# Patient Record
Sex: Female | Born: 1935 | Race: White | Hispanic: No | Smoking: Former smoker
Health system: Southern US, Community
[De-identification: ages and names within clinical notes are randomized; demographics above are authoritative.]

## PROBLEM LIST (undated history)

## (undated) DIAGNOSIS — I4891 Unspecified atrial fibrillation: Secondary | ICD-10-CM

## (undated) DIAGNOSIS — I509 Heart failure, unspecified: Secondary | ICD-10-CM

## (undated) DIAGNOSIS — E079 Disorder of thyroid, unspecified: Secondary | ICD-10-CM

## (undated) DIAGNOSIS — I219 Acute myocardial infarction, unspecified: Secondary | ICD-10-CM

## (undated) DIAGNOSIS — Z5189 Encounter for other specified aftercare: Secondary | ICD-10-CM

## (undated) DIAGNOSIS — IMO0001 Reserved for inherently not codable concepts without codable children: Secondary | ICD-10-CM

## (undated) DIAGNOSIS — M1990 Unspecified osteoarthritis, unspecified site: Secondary | ICD-10-CM

## (undated) DIAGNOSIS — I639 Cerebral infarction, unspecified: Secondary | ICD-10-CM

## (undated) DIAGNOSIS — D649 Anemia, unspecified: Secondary | ICD-10-CM

## (undated) DIAGNOSIS — I10 Essential (primary) hypertension: Secondary | ICD-10-CM

## (undated) HISTORY — PX: BREAST BIOPSY: SHX20

## (undated) HISTORY — PX: CORONARY ARTERY BYPASS GRAFT: SHX141

---

## 1988-09-25 HISTORY — PX: OTHER SURGICAL HISTORY: SHX169

## 2011-06-25 ENCOUNTER — Emergency Department (INDEPENDENT_AMBULATORY_CARE_PROVIDER_SITE_OTHER): Payer: Medicare Other

## 2011-06-25 ENCOUNTER — Emergency Department (HOSPITAL_BASED_OUTPATIENT_CLINIC_OR_DEPARTMENT_OTHER)
Admission: EM | Admit: 2011-06-25 | Discharge: 2011-06-25 | Disposition: A | Discharge status: Home / Self Care | Payer: Medicare Other | Attending: Emergency Medicine | Admitting: Emergency Medicine

## 2011-06-25 ENCOUNTER — Encounter: Payer: Self-pay | Admitting: *Deleted

## 2011-06-25 DIAGNOSIS — G319 Degenerative disease of nervous system, unspecified: Secondary | ICD-10-CM

## 2011-06-25 DIAGNOSIS — S8000XA Contusion of unspecified knee, initial encounter: Secondary | ICD-10-CM | POA: Insufficient documentation

## 2011-06-25 DIAGNOSIS — Z79899 Other long term (current) drug therapy: Secondary | ICD-10-CM | POA: Insufficient documentation

## 2011-06-25 DIAGNOSIS — W010XXA Fall on same level from slipping, tripping and stumbling without subsequent striking against object, initial encounter: Secondary | ICD-10-CM | POA: Insufficient documentation

## 2011-06-25 DIAGNOSIS — W19XXXA Unspecified fall, initial encounter: Secondary | ICD-10-CM

## 2011-06-25 DIAGNOSIS — Z8739 Personal history of other diseases of the musculoskeletal system and connective tissue: Secondary | ICD-10-CM | POA: Insufficient documentation

## 2011-06-25 DIAGNOSIS — IMO0002 Reserved for concepts with insufficient information to code with codable children: Secondary | ICD-10-CM | POA: Insufficient documentation

## 2011-06-25 DIAGNOSIS — E079 Disorder of thyroid, unspecified: Secondary | ICD-10-CM | POA: Insufficient documentation

## 2011-06-25 DIAGNOSIS — S022XXA Fracture of nasal bones, initial encounter for closed fracture: Secondary | ICD-10-CM

## 2011-06-25 DIAGNOSIS — S0083XA Contusion of other part of head, initial encounter: Secondary | ICD-10-CM | POA: Insufficient documentation

## 2011-06-25 DIAGNOSIS — I10 Essential (primary) hypertension: Secondary | ICD-10-CM | POA: Insufficient documentation

## 2011-06-25 DIAGNOSIS — J342 Deviated nasal septum: Secondary | ICD-10-CM

## 2011-06-25 DIAGNOSIS — S8001XA Contusion of right knee, initial encounter: Secondary | ICD-10-CM

## 2011-06-25 DIAGNOSIS — S80212A Abrasion, left knee, initial encounter: Secondary | ICD-10-CM

## 2011-06-25 DIAGNOSIS — S0003XA Contusion of scalp, initial encounter: Secondary | ICD-10-CM | POA: Insufficient documentation

## 2011-06-25 HISTORY — DX: Encounter for other specified aftercare: Z51.89

## 2011-06-25 HISTORY — DX: Unspecified osteoarthritis, unspecified site: M19.90

## 2011-06-25 HISTORY — DX: Essential (primary) hypertension: I10

## 2011-06-25 HISTORY — DX: Disorder of thyroid, unspecified: E07.9

## 2011-06-25 HISTORY — DX: Reserved for inherently not codable concepts without codable children: IMO0001

## 2011-06-25 HISTORY — DX: Acute myocardial infarction, unspecified: I21.9

## 2011-06-25 MED ORDER — TETANUS-DIPHTHERIA TOXOIDS TD 5-2 LFU IM INJ
0.5000 mL | INJECTION | Freq: Once | INTRAMUSCULAR | Status: AC
  Administered 2011-06-25: 0.5 mL via INTRAMUSCULAR
  Filled 2011-06-25: qty 0.5

## 2011-06-25 MED ORDER — TRAMADOL HCL 50 MG PO TABS: 50.0000 mg | ORAL_TABLET | Freq: Four times a day (QID) | ORAL | Status: AC | PRN

## 2011-06-25 NOTE — ED Notes (Signed)
Care plan and wound care reviewed with pt and family

## 2011-06-25 NOTE — ED Provider Notes (Signed)
History  Scribed for Dr. Golda Acre, the patient was seen in room MH07. The chart was scribed by Gilman Schmidt. The patients care was started at 1713.  CSN: [id number] Arrival date & time: 06/25/2011  4:56 PM  Chief Complaint  Patient presents with  . Head Laceration  . Fall    Patient is a 77 y.o. female presenting with scalp laceration and fall.  Head Laceration Pertinent negatives include no headaches.  Fall Pertinent negatives include no headaches.   Melissa Cardenas is a 77 y.o. female who presents to the Emergency Department complaining of head laceration from fall. Pt reports that she was walking to her car on uneven pavement and tripped and fell on her nose. Presents with laceration to bridge of nose and hematoma on right side of forehead. Pt is ambulatory. Denies any syncope or lightheadedness. Denies any other pain. There are no other associated symptoms and no other alleviating or aggravating factors.  Currently on Plavix.    PAST MEDICAL HISTORY:  Past Medical History  Diagnosis Date  . Arthritis   . Blood transfusion   . Heart attack   . Hypertension   . Thyroid disease      PAST SURGICAL HISTORY:  Past Surgical History  Procedure Date  . Cabg   . Breast biopsy      MEDICATIONS:  Previous Medications   CARVEDILOL (COREG) 6.25 MG TABLET    Take 6.25 mg by mouth 2 (two) times daily with a meal. Take 1 tab in the morning and 2 tabs at night    CLOPIDOGREL (PLAVIX) 75 MG TABLET    Take 75 mg by mouth daily.     EZETIMIBE (ZETIA) 10 MG TABLET    Take 10 mg by mouth daily.     RANOLAZINE (RANEXA) 500 MG 12 HR TABLET    Take 500 mg by mouth 2 (two) times daily.       ALLERGIES:  Allergies as of 06/25/2011 - Review Complete 06/25/2011  Allergen Reaction Noted  . Atromid s (clofibrate)  06/25/2011  . Avelox (moxifloxacin hydrochloride)  06/25/2011  . Nitrofuran derivatives  06/25/2011  . Other  06/25/2011  . Penicillins Hives and Itching 06/25/2011  . Sulfa antibiotics  Swelling 06/25/2011     FAMILY HISTORY:  No family history on file.   SOCIAL HISTORY: History  Substance Use Topics  . Smoking status: Former Games developer  . Smokeless tobacco: Not on file  . Alcohol Use: Not on file       Review of Systems  HENT: Positive for facial swelling. Negative for neck pain and neck stiffness.   Skin:       Laceration  Neurological: Negative for syncope, light-headedness and headaches.  All other systems reviewed and are negative.    Allergies  Atromid s; Avelox; Nitrofuran derivatives; Other; Penicillins; and Sulfa antibiotics  Home Medications   Current Outpatient Rx  Name Route Sig Dispense Refill  . CARVEDILOL 6.25 MG PO TABS Oral Take 6.25 mg by mouth 2 (two) times daily with a meal. Take 1 tab in the morning and 2 tabs at night     . CLOPIDOGREL BISULFATE 75 MG PO TABS Oral Take 75 mg by mouth daily.      Marland Kitchen EZETIMIBE 10 MG PO TABS Oral Take 10 mg by mouth daily.      Marland Kitchen RANOLAZINE 500 MG PO TB12 Oral Take 500 mg by mouth 2 (two) times daily.        BP 194/93  Pulse 78  Temp(Src) 98.1 F (36.7 C) (Oral)  Resp 20  Ht 5' (1.524 m)  Wt 106 lb (48.081 kg)  BMI 20.70 kg/m2  SpO2 97%  Physical Exam  Constitutional: She is oriented to person, place, and time. She appears well-developed and well-nourished.  Non-toxic appearance. She does not have a sickly appearance.  HENT:  Head: Normocephalic and atraumatic.  Nose: Nose lacerations (bridge of nose ) present.       Patient has a skin tear across the bridge of her nose without any significant depth to it. Right lateral forehead hematoma has mild abrasion that is approximately 8 mm in diameter on the top and a laceration present.  Eyes: Conjunctivae, EOM and lids are normal. Pupils are equal, round, and reactive to light. No scleral icterus.       Ecchymosis bilaterally   Neck: Trachea normal and normal range of motion. Neck supple.       No C-spine tendernes  Cardiovascular: Regular rhythm and  normal heart sounds.   Pulmonary/Chest: Effort normal and breath sounds normal.  Abdominal: Soft. Normal appearance. There is no tenderness. There is no rebound, no guarding and no CVA tenderness.  Musculoskeletal: Normal range of motion.       Left forearm has a hematoma but there is no tenderness over the bones. Right knee is swollen and has bruising present but patient is able to flex and extend her knee without any difficulty and is able to bear weight on that leg. Left knee has mild abrasion over it but again she is able flex and extend without difficulty and able to bear weight on it.  Neurological: She is alert and oriented to person, place, and time. She has normal strength.  Skin: Skin is warm, dry and intact. No rash noted.       Abrasion/Hematoma 3cm in diameter on forehead Laceration/Brusing on knees bilaterally   Psychiatric: She has a normal mood and affect. Her behavior is normal. Judgment and thought content normal.     ED Course  Procedures  OTHER DATA REVIEWED: Nursing notes, vital signs, and past medical records reviewed.  DIAGNOSTIC STUDIES: Oxygen Saturation is 97% on room air, normal by my interpretation.     RADIOLOGY:No results found for this or any previous visit. Ct Head Wo Contrast  06/25/2011  *RADIOLOGY REPORT*  Clinical Data:  Fall  CT HEAD WITHOUT CONTRAST CT MAXILLOFACIAL WITHOUT CONTRAST  Technique:  Multidetector CT imaging of the head and maxillofacial structures were performed using the standard protocol without intravenous contrast. Multiplanar CT image reconstructions of the maxillofacial structures were also generated.  Comparison:  None.  CT HEAD  Findings: No skull fracture is noted.  There is scalp swelling and subcutaneous stranding in right frontal lateral scalp.  Mild cerebral atrophy.  No intracranial hemorrhage, mass effect or midline shift.  No acute infarction.  No mass lesion is noted on this unenhanced scan.  IMPRESSION: No acute intracranial  abnormality.  Scalp swelling and subcutaneous stranding in the right lateral scalp mild cerebral atrophy.  CT MAXILLOFACIAL  Findings:   Axial images shows bilateral nasal bone fracture.  Metallic dental artifact are noted.  No paranasal sinuses air fluid levels.  Computer processed images shows no orbital floor or orbital rim fracture.  No zygomatic fracture is noted.  Mild perinasal soft tissue swelling is noted.  There is mild left nasal septum deviation.  Bilateral semilunar canal is patent.  No air-fluid levels are noted in the paranasal sinuses.  No orbital hematoma  or fluid collection. There is no mandibular fracture  identified.  No TMJ dislocation.  IMPRESSION:  1.  Bilateral fractures of the nasal bones. 2.  Mild left nasal septum deviation. 3.  No orbital rim or orbital floor fracture.  No orbital hematoma.  Original Report Authenticated By: Natasha Mead, M.D.   Ct Maxillofacial Wo Cm  06/25/2011  *RADIOLOGY REPORT*  Clinical Data:  Fall  CT HEAD WITHOUT CONTRAST CT MAXILLOFACIAL WITHOUT CONTRAST  Technique:  Multidetector CT imaging of the head and maxillofacial structures were performed using the standard protocol without intravenous contrast. Multiplanar CT image reconstructions of the maxillofacial structures were also generated.  Comparison:  None.  CT HEAD  Findings: No skull fracture is noted.  There is scalp swelling and subcutaneous stranding in right frontal lateral scalp.  Mild cerebral atrophy.  No intracranial hemorrhage, mass effect or midline shift.  No acute infarction.  No mass lesion is noted on this unenhanced scan.  IMPRESSION: No acute intracranial abnormality.  Scalp swelling and subcutaneous stranding in the right lateral scalp mild cerebral atrophy.  CT MAXILLOFACIAL  Findings:   Axial images shows bilateral nasal bone fracture.  Metallic dental artifact are noted.  No paranasal sinuses air fluid levels.  Computer processed images shows no orbital floor or orbital rim fracture.  No  zygomatic fracture is noted.  Mild perinasal soft tissue swelling is noted.  There is mild left nasal septum deviation.  Bilateral semilunar canal is patent.  No air-fluid levels are noted in the paranasal sinuses.  No orbital hematoma or fluid collection. There is no mandibular fracture  identified.  No TMJ dislocation.  IMPRESSION:  1.  Bilateral fractures of the nasal bones. 2.  Mild left nasal septum deviation. 3.  No orbital rim or orbital floor fracture.  No orbital hematoma.  Original Report Authenticated By: Natasha Mead, M.D.     CT Head  CT MAxillofacial   MDM: Patient is on Plavix so a CT of her head was obtained to rule out any intracranial hemorrhage but is not present at this point in time. Patient is at her baseline mental status an alert and oriented with no loss of consciousness at the time of the incident. I feel she is safe for discharge home from the standpoint. She does have nasal bone fractures which I've instructed her to be followed up by a facial surgeon or air nose and throat physician in one to 2 weeks once swelling is decreased. She has no nasal septal hematomas or respiratory distress at this point in time despite the septum deviation found on her CT. Her skin tear across her nose is unable to be repaired since a piece of the skin is actually missing and there is no significant depth to the wound to be repaired. Patient will be discharged home and family members at bedside to assist her.  IMPRESSION: Diagnoses that have been ruled out:  Diagnoses that are still under consideration:  Final diagnoses:    PLAN:  Home. The patient is to return the emergency department if there is any worsening of symptoms. I have reviewed the discharge instructions with the patient. CONDITION ON DISCHARGE: Stable.   MEDICATIONS GIVEN IN THE E.D.  Medications  carvedilol (COREG) 6.25 MG tablet (not administered)  ranolazine (RANEXA) 500 MG 12 hr tablet (not administered)  clopidogrel  (PLAVIX) 75 MG tablet (not administered)  ezetimibe (ZETIA) 10 MG tablet (not administered)  tetanus & diphtheria toxoids (adult) [facility]) injection 0.5 mL (not administered)  rosuvastatin (CRESTOR) 40 MG tablet (not administered)  cholecalciferol (VITAMIN D) 1000 UNITS tablet (not administered)  Calcium Carbonate-Vitamin D (CALTRATE 600+D) 600-400 MG-UNIT per tablet (not administered)  pantoprazole (PROTONIX) 40 MG tablet (not administered)  polyethylene glycol powder (GLYCOLAX/MIRALAX) powder (not administered)  aspirin EC 81 MG tablet (not administered)    DISCHARGE MEDICATIONS: New Prescriptions   No medications on file    SCRIBE ATTESTATION: I personally performed the services described in this documentation, which was scribed in my presence. The recorded information has been reviewed and considered.              Nat Christen, MD 06/25/11 [phone]

## 2011-06-25 NOTE — ED Notes (Signed)
Bacitracin and drsg applied to all abrasions sterile drsg to nose  No active bleeding MD at side care reviewed

## 2011-06-25 NOTE — ED Notes (Signed)
Patient ambulates to restroom without difficulty.

## 2011-06-25 NOTE — ED Notes (Signed)
Pt reports she fell on uneven pavement- laceration to bridge of nose and hematoma right side of forehead- denies LOC- denies neck pain

## 2011-06-25 NOTE — ED Notes (Signed)
1 inch skin tear abrasion to mid nose brow/ 2 inch hematoma to R side upper forehead bruising to Knees bilaterally Dr Golda Acre at side bleeding controlled care plan reviewed

## 2016-09-24 ENCOUNTER — Encounter (HOSPITAL_BASED_OUTPATIENT_CLINIC_OR_DEPARTMENT_OTHER): Payer: Self-pay | Admitting: *Deleted

## 2016-09-24 ENCOUNTER — Emergency Department (HOSPITAL_BASED_OUTPATIENT_CLINIC_OR_DEPARTMENT_OTHER)
Admission: EM | Admit: 2016-09-24 | Discharge: 2016-09-24 | Disposition: A | Discharge status: Home / Self Care | Payer: Medicare Other | Attending: Emergency Medicine | Admitting: Emergency Medicine

## 2016-09-24 DIAGNOSIS — I10 Essential (primary) hypertension: Secondary | ICD-10-CM | POA: Insufficient documentation

## 2016-09-24 DIAGNOSIS — Z7982 Long term (current) use of aspirin: Secondary | ICD-10-CM | POA: Insufficient documentation

## 2016-09-24 DIAGNOSIS — R319 Hematuria, unspecified: Secondary | ICD-10-CM | POA: Diagnosis present

## 2016-09-24 DIAGNOSIS — N3091 Cystitis, unspecified with hematuria: Secondary | ICD-10-CM | POA: Insufficient documentation

## 2016-09-24 DIAGNOSIS — Z87891 Personal history of nicotine dependence: Secondary | ICD-10-CM | POA: Diagnosis not present

## 2016-09-24 DIAGNOSIS — Z79899 Other long term (current) drug therapy: Secondary | ICD-10-CM | POA: Insufficient documentation

## 2016-09-24 DIAGNOSIS — N390 Urinary tract infection, site not specified: Secondary | ICD-10-CM

## 2016-09-24 DIAGNOSIS — N3090 Cystitis, unspecified without hematuria: Secondary | ICD-10-CM

## 2016-09-24 DIAGNOSIS — R310 Gross hematuria: Secondary | ICD-10-CM

## 2016-09-24 LAB — URINALYSIS, ROUTINE W REFLEX MICROSCOPIC

## 2016-09-24 LAB — URINALYSIS, MICROSCOPIC (REFLEX)

## 2016-09-24 MED ORDER — PHENAZOPYRIDINE HCL 200 MG PO TABS: 200.0000 mg | ORAL_TABLET | Freq: Three times a day (TID) | ORAL | 0 refills | Status: DC

## 2016-09-24 MED ORDER — PHENAZOPYRIDINE HCL 100 MG PO TABS
100.0000 mg | ORAL_TABLET | Freq: Once | ORAL | Status: AC
  Administered 2016-09-24: 100 mg via ORAL
  Filled 2016-09-24: qty 1

## 2016-09-24 MED ORDER — CIPROFLOXACIN HCL 500 MG PO TABS
500.0000 mg | ORAL_TABLET | Freq: Two times a day (BID) | ORAL | 0 refills | Status: DC
Start: 2016-09-24 — End: 2021-08-17

## 2016-09-24 MED ORDER — CIPROFLOXACIN HCL 500 MG PO TABS
500.0000 mg | ORAL_TABLET | Freq: Once | ORAL | Status: AC
  Administered 2016-09-24: 500 mg via ORAL
  Filled 2016-09-24: qty 1

## 2016-09-24 NOTE — ED Triage Notes (Addendum)
Pt report hematuria, dysuria, frequency and urgency that developed around 0700 today. Denies fever, n/v/d, abd pain. Reports taking baby ASA (hasn't taken today).

## 2016-09-24 NOTE — Discharge Instructions (Signed)
Recheck with your physician if the blood in your urine, or symptoms persist more than 4-5 days.

## 2016-09-24 NOTE — ED Triage Notes (Addendum)
Pt has brought a urine specimen from home that was collected in a sterile specimen cup around 0805 this am.

## 2016-09-24 NOTE — ED Provider Notes (Signed)
[facility]   CSN: [id number] Arrival date & time: 09/24/16  0844     History   Chief Complaint Chief Complaint  Patient presents with  . Hematuria    HPI Melissa Cardenas is a 82 y.o. female. She presents with hematuria and painful urination. She waking this morning asymptomatic after emptying her bladder she noticed blood. She's had frequency every 15 minutes since that time with burning. No back pain. No nausea vomiting or fever.  HPI  Past Medical History:  Diagnosis Date  . Arthritis   . Blood transfusion   . Heart attack   . Hypertension   . Thyroid disease     There are no active problems to display for this patient.   Past Surgical History:  Procedure Laterality Date  . BREAST BIOPSY    . cabg      OB History    No data available       Home Medications    Prior to Admission medications   Medication Sig Start Date End Date Taking? Authorizing Provider  aspirin EC 81 MG tablet Take 81 mg by mouth daily.     Yes Historical Provider, MD  Calcium Carbonate-Vitamin D (CALTRATE 600+D) 600-400 MG-UNIT per tablet Take 1 tablet by mouth daily.     Yes Historical Provider, MD  carvedilol (COREG) 6.25 MG tablet Take 6.25 mg by mouth 2 (two) times daily with a meal. Take 1 tab in the morning and 2 tabs at night    Yes Historical Provider, MD  cholecalciferol (VITAMIN D) 1000 UNITS tablet Take 1,000 Units by mouth daily.     Yes Historical Provider, MD  Hyoscyamine Sulfate SL 0.125 MG SUBL Place under the tongue.   Yes Historical Provider, MD  Multiple Vitamins-Minerals (CENTRUM SILVER PO) Take by mouth.   Yes Historical Provider, MD  nitroGLYCERIN (NITROSTAT) 0.4 MG SL tablet Place 0.4 mg under the tongue every 5 (five) minutes as needed for chest pain.   Yes Historical Provider, MD  polyethylene glycol powder (GLYCOLAX/MIRALAX) powder Take 17 g by mouth daily.     Yes Historical Provider, MD  ranolazine (RANEXA) 500 MG 12 hr tablet Take  500 mg by mouth 2 (two) times daily.     Yes Historical Provider, MD  rosuvastatin (CRESTOR) 40 MG tablet Take 40 mg by mouth daily.     Yes Historical Provider, MD  ciprofloxacin (CIPRO) 500 MG tablet Take 1 tablet (500 mg total) by mouth 2 (two) times daily. 09/24/16   Rolland PorterMark Meighan Treto, MD  clopidogrel (PLAVIX) 75 MG tablet Take 75 mg by mouth daily.      Historical Provider, MD  ezetimibe (ZETIA) 10 MG tablet Take 10 mg by mouth daily.      Historical Provider, MD  pantoprazole (PROTONIX) 40 MG tablet Take 40 mg by mouth daily.      Historical Provider, MD  phenazopyridine (PYRIDIUM) 200 MG tablet Take 1 tablet (200 mg total) by mouth 3 (three) times daily. 09/24/16   Rolland PorterMark Halimah Bewick, MD    Family History No family history on file.  Social History Social History  Substance Use Topics  . Smoking status: Former Games developermoker  . Smokeless tobacco: Never Used  . Alcohol use No     Allergies   Atromid s [clofibrate]; Avelox [moxifloxacin hydrochloride]; Nitrofuran derivatives; Other; Penicillins; and Sulfa antibiotics   Review of Systems Review of Systems  Constitutional: Negative for appetite change, chills, diaphoresis, fatigue and fever.  HENT: Negative for mouth  sores, sore throat and trouble swallowing.   Eyes: Negative for visual disturbance.  Respiratory: Negative for cough, chest tightness, shortness of breath and wheezing.   Cardiovascular: Negative for chest pain.  Gastrointestinal: Negative for abdominal distention, abdominal pain, diarrhea, nausea and vomiting.  Endocrine: Negative for polydipsia, polyphagia and polyuria.  Genitourinary: Positive for dysuria and hematuria. Negative for frequency.  Musculoskeletal: Negative for gait problem.  Skin: Negative for color change, pallor and rash.  Neurological: Negative for dizziness, syncope, light-headedness and headaches.  Hematological: Does not bruise/bleed easily.  Psychiatric/Behavioral: Negative for behavioral problems and confusion.      Physical Exam Updated Vital Signs BP 177/91 (BP Location: Right Arm)   Pulse 82   Temp 97.8 F (36.6 C) (Oral)   Resp 18   Ht 4\' 11"  (1.499 m)   Wt 106 lb (48.1 kg)   SpO2 95%   BMI 21.41 kg/m   Physical Exam  Constitutional: She is oriented to person, place, and time. She appears well-developed and well-nourished. No distress.  HENT:  Head: Normocephalic.  Eyes: Conjunctivae are normal. Pupils are equal, round, and reactive to light. No scleral icterus.  Neck: Normal range of motion. Neck supple. No thyromegaly present.  Cardiovascular: Normal rate and regular rhythm.  Exam reveals no gallop and no friction rub.   No murmur heard. Pulmonary/Chest: Effort normal and breath sounds normal. No respiratory distress. She has no wheezes. She has no rales.  Abdominal: Soft. Bowel sounds are normal. She exhibits no distension. There is no tenderness. There is no rebound.  Musculoskeletal: Normal range of motion.  Neurological: She is alert and oriented to person, place, and time.  Skin: Skin is warm and dry. No rash noted.  Psychiatric: She has a normal mood and affect. Her behavior is normal.     ED Treatments / Results  Labs (all labs ordered are listed, but only abnormal results are displayed) Labs Reviewed  URINALYSIS, ROUTINE W REFLEX MICROSCOPIC    EKG  EKG Interpretation None       Radiology No results found.  Procedures Procedures (including critical care time)  Medications Ordered in ED Medications  ciprofloxacin (CIPRO) tablet 500 mg (500 mg Oral Given 09/24/16 0922)  phenazopyridine (PYRIDIUM) tablet 100 mg (100 mg Oral Given 09/24/16 [id number])     Initial Impression / Assessment and Plan / ED Course  I have reviewed the triage vital signs and the nursing notes.  Pertinent labs & imaging results that were available during my care of the patient were reviewed by me and considered in my medical decision making (see chart for details).  Clinical  Course     Likely simple cystitis. Hemorrhagic. No symptoms or findings to suggest upper tract disease or pyelonephritis. With consideration of her multiple allergies Cipro is the best option. Given Cipro Pyridium. Prescriptions for the same.  Final Clinical Impressions(s) / ED Diagnoses   Final diagnoses:  Cystitis  Gross hematuria  Lower urinary tract infectious disease    New Prescriptions New Prescriptions   CIPROFLOXACIN (CIPRO) 500 MG TABLET    Take 1 tablet (500 mg total) by mouth 2 (two) times daily.   PHENAZOPYRIDINE (PYRIDIUM) 200 MG TABLET    Take 1 tablet (200 mg total) by mouth 3 (three) times daily.     Rolland PorterMark Kazue Cerro, MD 09/24/16 0930

## 2016-11-05 ENCOUNTER — Observation Stay (HOSPITAL_COMMUNITY): Payer: Medicare Other

## 2016-11-05 ENCOUNTER — Encounter (HOSPITAL_COMMUNITY): Payer: Self-pay | Admitting: Emergency Medicine

## 2016-11-05 ENCOUNTER — Observation Stay (HOSPITAL_COMMUNITY)
Admission: EM | Admit: 2016-11-05 | Discharge: 2016-11-06 | Disposition: A | Discharge status: Home / Self Care | Payer: Medicare Other | Attending: Internal Medicine | Admitting: Internal Medicine

## 2016-11-05 ENCOUNTER — Emergency Department (HOSPITAL_COMMUNITY): Payer: Medicare Other

## 2016-11-05 DIAGNOSIS — E079 Disorder of thyroid, unspecified: Secondary | ICD-10-CM | POA: Diagnosis not present

## 2016-11-05 DIAGNOSIS — I110 Hypertensive heart disease with heart failure: Secondary | ICD-10-CM | POA: Insufficient documentation

## 2016-11-05 DIAGNOSIS — Z7902 Long term (current) use of antithrombotics/antiplatelets: Secondary | ICD-10-CM | POA: Insufficient documentation

## 2016-11-05 DIAGNOSIS — Z79899 Other long term (current) drug therapy: Secondary | ICD-10-CM | POA: Diagnosis not present

## 2016-11-05 DIAGNOSIS — G459 Transient cerebral ischemic attack, unspecified: Secondary | ICD-10-CM

## 2016-11-05 DIAGNOSIS — Z7982 Long term (current) use of aspirin: Secondary | ICD-10-CM | POA: Diagnosis not present

## 2016-11-05 DIAGNOSIS — I10 Essential (primary) hypertension: Secondary | ICD-10-CM | POA: Insufficient documentation

## 2016-11-05 DIAGNOSIS — I639 Cerebral infarction, unspecified: Secondary | ICD-10-CM | POA: Diagnosis not present

## 2016-11-05 DIAGNOSIS — E785 Hyperlipidemia, unspecified: Secondary | ICD-10-CM | POA: Insufficient documentation

## 2016-11-05 DIAGNOSIS — Z792 Long term (current) use of antibiotics: Secondary | ICD-10-CM | POA: Insufficient documentation

## 2016-11-05 DIAGNOSIS — Z951 Presence of aortocoronary bypass graft: Secondary | ICD-10-CM | POA: Diagnosis not present

## 2016-11-05 DIAGNOSIS — I5030 Unspecified diastolic (congestive) heart failure: Secondary | ICD-10-CM | POA: Insufficient documentation

## 2016-11-05 DIAGNOSIS — Z8249 Family history of ischemic heart disease and other diseases of the circulatory system: Secondary | ICD-10-CM | POA: Insufficient documentation

## 2016-11-05 DIAGNOSIS — R29810 Facial weakness: Secondary | ICD-10-CM | POA: Insufficient documentation

## 2016-11-05 DIAGNOSIS — R4781 Slurred speech: Secondary | ICD-10-CM | POA: Insufficient documentation

## 2016-11-05 DIAGNOSIS — Z87891 Personal history of nicotine dependence: Secondary | ICD-10-CM | POA: Insufficient documentation

## 2016-11-05 DIAGNOSIS — I2510 Atherosclerotic heart disease of native coronary artery without angina pectoris: Secondary | ICD-10-CM | POA: Diagnosis not present

## 2016-11-05 DIAGNOSIS — R262 Difficulty in walking, not elsewhere classified: Secondary | ICD-10-CM

## 2016-11-05 DIAGNOSIS — E7849 Other hyperlipidemia: Secondary | ICD-10-CM

## 2016-11-05 LAB — PROTIME-INR
INR: 1.09
PROTHROMBIN TIME: 14.1 s (ref 11.4–15.2)

## 2016-11-05 LAB — LIPID PANEL
Cholesterol: 156 mg/dL (ref 0–200)
HDL: 52 mg/dL (ref >40)
LDL CALC: 94 mg/dL (ref 0–99)
Total CHOL/HDL Ratio: 3 RATIO
Triglycerides: 48 mg/dL (ref <150)
VLDL: 10 mg/dL (ref 0–40)

## 2016-11-05 LAB — COMPREHENSIVE METABOLIC PANEL
ALK PHOS: 49 U/L (ref 38–126)
ALT: 18 U/L (ref 14–54)
ANION GAP: 10 (ref 5–15)
AST: 28 U/L (ref 15–41)
Albumin: 3.3 g/dL — ABNORMAL LOW (ref 3.5–5.0)
BUN: 12 mg/dL (ref 6–20)
CHLORIDE: 100 mmol/L — AB (ref 101–111)
CO2: 25 mmol/L (ref 22–32)
Calcium: 9.1 mg/dL (ref 8.9–10.3)
Creatinine, Ser: 0.75 mg/dL (ref 0.44–1.00)
GFR calc non Af Amer: >60 mL/min (ref >60)
GLUCOSE: 106 mg/dL — AB (ref 65–99)
POTASSIUM: 3.9 mmol/L (ref 3.5–5.1)
SODIUM: 135 mmol/L (ref 135–145)
Total Bilirubin: 0.8 mg/dL (ref 0.3–1.2)
Total Protein: 6.4 g/dL — ABNORMAL LOW (ref 6.5–8.1)

## 2016-11-05 LAB — I-STAT CHEM 8, ED
BUN: 13 mg/dL (ref 6–20)
CHLORIDE: 101 mmol/L (ref 101–111)
Calcium, Ion: 1.18 mmol/L (ref 1.15–1.40)
Creatinine, Ser: 0.7 mg/dL (ref 0.44–1.00)
GLUCOSE: 105 mg/dL — AB (ref 65–99)
HCT: 33 % — ABNORMAL LOW (ref 36.0–46.0)
HEMOGLOBIN: 11.2 g/dL — AB (ref 12.0–15.0)
POTASSIUM: 3.9 mmol/L (ref 3.5–5.1)
SODIUM: 137 mmol/L (ref 135–145)
TCO2: 26 mmol/L (ref 0–100)

## 2016-11-05 LAB — URINALYSIS, ROUTINE W REFLEX MICROSCOPIC
BILIRUBIN URINE: NEGATIVE
GLUCOSE, UA: NEGATIVE mg/dL
Hgb urine dipstick: NEGATIVE
KETONES UR: NEGATIVE mg/dL
LEUKOCYTES UA: NEGATIVE
NITRITE: NEGATIVE
PH: 8 (ref 5.0–8.0)
Protein, ur: NEGATIVE mg/dL
SPECIFIC GRAVITY, URINE: 1.005 (ref 1.005–1.030)

## 2016-11-05 LAB — I-STAT TROPONIN, ED: Troponin i, poc: 0.01 ng/mL (ref 0.00–0.08)

## 2016-11-05 LAB — CBC
HCT: 32.8 % — ABNORMAL LOW (ref 36.0–46.0)
Hemoglobin: 11.1 g/dL — ABNORMAL LOW (ref 12.0–15.0)
MCH: 29.9 pg (ref 26.0–34.0)
MCHC: 33.8 g/dL (ref 30.0–36.0)
MCV: 88.4 fL (ref 78.0–100.0)
PLATELETS: 196 10*3/uL (ref 150–400)
RBC: 3.71 MIL/uL — AB (ref 3.87–5.11)
RDW: 14.7 % (ref 11.5–15.5)
WBC: 4.9 10*3/uL (ref 4.0–10.5)

## 2016-11-05 LAB — DIFFERENTIAL
BASOS PCT: 1 %
Basophils Absolute: 0 10*3/uL (ref 0.0–0.1)
EOS ABS: 0.2 10*3/uL (ref 0.0–0.7)
EOS PCT: 4 %
Lymphocytes Relative: 22 %
Lymphs Abs: 1.1 10*3/uL (ref 0.7–4.0)
MONO ABS: 0.4 10*3/uL (ref 0.1–1.0)
Monocytes Relative: 8 %
NEUTROS PCT: 65 %
Neutro Abs: 3.2 10*3/uL (ref 1.7–7.7)

## 2016-11-05 LAB — RAPID URINE DRUG SCREEN, HOSP PERFORMED
AMPHETAMINES: NOT DETECTED
Barbiturates: NOT DETECTED
Benzodiazepines: NOT DETECTED
COCAINE: NOT DETECTED
OPIATES: NOT DETECTED
TETRAHYDROCANNABINOL: NOT DETECTED

## 2016-11-05 LAB — CBG MONITORING, ED: GLUCOSE-CAPILLARY: 103 mg/dL — AB (ref 65–99)

## 2016-11-05 LAB — APTT: aPTT: 28 seconds (ref 24–36)

## 2016-11-05 MED ORDER — ADULT MULTIVITAMIN W/MINERALS CH
1.0000 | ORAL_TABLET | Freq: Every day | ORAL | Status: DC
Start: 2016-11-05 — End: 2016-11-06
  Administered 2016-11-05 – 2016-11-06 (×2): 1 via ORAL
  Filled 2016-11-05 (×2): qty 1

## 2016-11-05 MED ORDER — RANOLAZINE ER 500 MG PO TB12
500.0000 mg | ORAL_TABLET | Freq: Two times a day (BID) | ORAL | Status: DC
  Administered 2016-11-05 – 2016-11-06 (×2): 500 mg via ORAL
  Filled 2016-11-05 (×2): qty 1

## 2016-11-05 MED ORDER — POLYETHYLENE GLYCOL 3350 17 GM/SCOOP PO POWD
17.0000 g | Freq: Every day | ORAL | Status: DC
  Filled 2016-11-05: qty 255

## 2016-11-05 MED ORDER — HYOSCYAMINE SULFATE 0.125 MG SL SUBL
0.1250 mg | SUBLINGUAL_TABLET | SUBLINGUAL | Status: DC | PRN
  Filled 2016-11-05: qty 1

## 2016-11-05 MED ORDER — NITROGLYCERIN 0.4 MG SL SUBL: 0.4000 mg | SUBLINGUAL_TABLET | SUBLINGUAL | Status: DC | PRN

## 2016-11-05 MED ORDER — ASPIRIN 300 MG RE SUPP: 300.0000 mg | Freq: Every day | RECTAL | Status: DC

## 2016-11-05 MED ORDER — ACETAMINOPHEN 160 MG/5ML PO SOLN: 650.0000 mg | ORAL | Status: DC | PRN

## 2016-11-05 MED ORDER — ACETAMINOPHEN 650 MG RE SUPP
650.0000 mg | RECTAL | Status: DC | PRN
Start: 2016-11-05 — End: 2016-11-06

## 2016-11-05 MED ORDER — ASPIRIN 325 MG PO TABS
325.0000 mg | ORAL_TABLET | Freq: Every day | ORAL | Status: DC
  Administered 2016-11-05 – 2016-11-06 (×2): 325 mg via ORAL
  Filled 2016-11-05 (×2): qty 1

## 2016-11-05 MED ORDER — STROKE: EARLY STAGES OF RECOVERY BOOK
Freq: Once | Status: DC
  Filled 2016-11-05: qty 1

## 2016-11-05 MED ORDER — ACETAMINOPHEN 325 MG PO TABS: 650.0000 mg | ORAL_TABLET | ORAL | Status: DC | PRN

## 2016-11-05 MED ORDER — ROSUVASTATIN CALCIUM 40 MG PO TABS
40.0000 mg | ORAL_TABLET | Freq: Every day | ORAL | Status: DC
  Administered 2016-11-05: 40 mg via ORAL
  Filled 2016-11-05: qty 1
  Filled 2016-11-05: qty 2
  Filled 2016-11-05: qty 1

## 2016-11-05 MED ORDER — POLYETHYLENE GLYCOL 3350 17 G PO PACK
17.0000 g | PACK | Freq: Every day | ORAL | Status: DC
  Administered 2016-11-05: 17 g via ORAL
  Filled 2016-11-05 (×2): qty 1

## 2016-11-05 MED ORDER — ENOXAPARIN SODIUM 40 MG/0.4ML ~~LOC~~ SOLN
40.0000 mg | SUBCUTANEOUS | Status: DC
  Administered 2016-11-06: 40 mg via SUBCUTANEOUS
  Filled 2016-11-05: qty 0.4

## 2016-11-05 NOTE — Evaluation (Signed)
Physical Therapy Evaluation Patient Details Name: Melissa Cardenas MRN: [id number] DOB: [birth year] Today's Date: 11/05/2016   History of Present Illness  83 year old female with a medical history of hypertension, thyroid disease, hyperlipidemia and coronary artery disease status post CABG who presents to the emergency department with slurred speech and right-sided facial droop.  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  Clinical Impression  PT eval complete. Pt is independent with all functional mobility. R facial droop still present at time of eval. No further PT intervention indicated. PT signing off.    Follow Up Recommendations No PT follow up;Supervision - Intermittent    Equipment Recommendations  None recommended by PT    Recommendations for Other Services       Precautions / Restrictions Precautions Precautions: None      Mobility  Bed Mobility Overal bed mobility: Independent                Transfers Overall transfer level: Independent Equipment used: None                Ambulation/Gait Ambulation/Gait assistance: Modified independent (Device/Increase time) Ambulation Distance (Feet): 250 Feet Assistive device: None Gait Pattern/deviations: WFL(Within Functional Limits)   Gait velocity interpretation: at or above normal speed for age/gender    Stairs            Wheelchair Mobility    Modified Rankin (Stroke Patients Only) Modified Rankin (Stroke Patients Only) Pre-Morbid Rankin Score: No symptoms Modified Rankin: No significant disability     Balance Overall balance assessment: No apparent balance deficits (not formally assessed)                                           Pertinent Vitals/Pain Pain Assessment: Faces Faces Pain Scale: Hurts little more Pain Location: low back after ambulation Pain Descriptors / Indicators: Aching;Sore Pain Intervention(s): Limited activity within patient's  tolerance;Repositioned;Relaxation    Home Living Family/patient expects to be discharged to:: Private residence Living Arrangements: Alone Available Help at Discharge: Family;Available PRN/intermittently (daughter lives close by) Type of Home: Apartment Home Access: Level entry     Home Layout: One level Home Equipment: Walker - 4 wheels;Cane - single point;Electric scooter      Prior Function Level of Independence: Independent with assistive device(s)         Comments: Lives alone. Independent. Drives. Does all her own cooking, housekeeping and shopping. Occassional use of cane. RW uses for increased ambulation in community due to chronic back pain. Electric scooter used for big trips [place]     Hand Dominance        Extremity/Trunk Assessment   Upper Extremity Assessment Upper Extremity Assessment: Defer to OT evaluation    Lower Extremity Assessment Lower Extremity Assessment: Overall WFL for tasks assessed (R/L symmetrical )    Cervical / Trunk Assessment Cervical / Trunk Assessment: Kyphotic  Communication   Communication: No difficulties  Cognition Arousal/Alertness: Awake/alert Behavior During Therapy: WFL for tasks assessed/performed Overall Cognitive Status: Within Functional Limits for tasks assessed                      General Comments      Exercises     Assessment/Plan    PT Assessment Patent does not need any further PT services  PT Problem List  PT Treatment Interventions      PT Goals (Current goals can be found in the Care Plan section)  Acute Rehab PT Goals Patient Stated Goal: home PT Goal Formulation: All assessment and education complete, DC therapy    Frequency     Barriers to discharge        Co-evaluation               End of Session   Activity Tolerance: Patient tolerated treatment well Patient left: in bed;with call bell/phone within reach Nurse Communication:  Mobility status    Functional Assessment Tool Used: clinical judgement Functional Limitation: Mobility: Walking and moving around Mobility: Walking and Moving Around Current Status [phone]): 0 percent impaired, limited or restricted Mobility: Walking and Moving Around Goal Status [phone]): 0 percent impaired, limited or restricted Mobility: Walking and Moving Around Discharge Status [phone]): 0 percent impaired, limited or restricted    Time: 8295-6213 PT Time Calculation (min) (ACUTE ONLY): 21 min   Charges:   PT Evaluation $PT Eval Low Complexity: 1 Procedure     PT G Codes:   PT G-Codes **NOT FOR INPATIENT CLASS** Functional Assessment Tool Used: clinical judgement Functional Limitation: Mobility: Walking and moving around Mobility: Walking and Moving Around Current Status (Y8657): 0 percent impaired, limited or restricted Mobility: Walking and Moving Around Goal Status (Q4696): 0 percent impaired, limited or restricted Mobility: Walking and Moving Around Discharge Status (E9528): 0 percent impaired, limited or restricted    Ilda Foil 11/05/2016, 3:20 PM

## 2016-11-05 NOTE — ED Notes (Signed)
Admitting MDs at bedside.

## 2016-11-05 NOTE — ED Triage Notes (Signed)
[facility] states pt woke up at 0310 this date with slurred speech per the family. EMS advised the patient immediately too a blood pressure pill and the family called [id number]. EMS reports the patient is negative for any weakness, nausea, vomiting, or headache however she is positive for slurred speech. EMS advised the patient walked to the stretcher with no GAIT problems. EMS advised the pt was last seen normal at 2000hrs on 11/04/16.  Pt states she feels as though she is slurring some words but not all words. Pt also complains of being extremely thirsty with minor nausea. Pt states before going to bed she took some powder to have a regular bowel movement.

## 2016-11-05 NOTE — ED Provider Notes (Signed)
TIME SEEN: 5:00 AM  CHIEF COMPLAINT: slurred speech, facial droop  HPI: Pt is a 83 y.o. female with history of hypertension, thyroid disease, hyperlipidemia, CAD who presents emergency department with slurred speech and right-sided facial droop. States that she went to bed last night around 8 PM and was feeling well. Woke up at 2:30 AM and thought that her speech was slightly slurred and drink some water and felt like it was "dripping out the left side of my face". States that she looked in the mirror and thought she had right-sided facial droop but was unable to tell as she states "I have macular degeneration". Reports her speech is back to normal. She has had no recent headache or head injury. Not on anticoagulation or antiplatelets. No history of CVA or TIA. Blood pressure was 210/108 with EMS but has improved without intervention. She reports she does live alone. No chest pain or shortness of breath.  ROS: See HPI Constitutional: no fever  Eyes: no drainage  ENT: no runny nose   Cardiovascular:  no chest pain  Resp: no SOB  GI: no vomiting GU: no dysuria Integumentary: no rash  Allergy: no hives  Musculoskeletal: no leg swelling  Neurological:  slurred speech ROS otherwise negative  PAST MEDICAL HISTORY/PAST SURGICAL HISTORY:  Past Medical History:  Diagnosis Date  . Arthritis   . Blood transfusion   . Heart attack   . Hypertension   . Thyroid disease     MEDICATIONS:  Prior to Admission medications   Medication Sig Start Date End Date Taking? Authorizing Provider  aspirin EC 81 MG tablet Take 81 mg by mouth daily.      Historical Provider, MD  Calcium Carbonate-Vitamin D (CALTRATE 600+D) 600-400 MG-UNIT per tablet Take 1 tablet by mouth daily.      Historical Provider, MD  carvedilol (COREG) 6.25 MG tablet Take 6.25 mg by mouth 2 (two) times daily with a meal. Take 1 tab in the morning and 2 tabs at night     Historical Provider, MD  cholecalciferol (VITAMIN D) 1000 UNITS  tablet Take 1,000 Units by mouth daily.      Historical Provider, MD  ciprofloxacin (CIPRO) 500 MG tablet Take 1 tablet (500 mg total) by mouth 2 (two) times daily. 09/24/16   Rolland Porter, MD  clopidogrel (PLAVIX) 75 MG tablet Take 75 mg by mouth daily.      Historical Provider, MD  ezetimibe (ZETIA) 10 MG tablet Take 10 mg by mouth daily.      Historical Provider, MD  Hyoscyamine Sulfate SL 0.125 MG SUBL Place under the tongue.    Historical Provider, MD  Multiple Vitamins-Minerals (CENTRUM SILVER PO) Take by mouth.    Historical Provider, MD  nitroGLYCERIN (NITROSTAT) 0.4 MG SL tablet Place 0.4 mg under the tongue every 5 (five) minutes as needed for chest pain.    Historical Provider, MD  pantoprazole (PROTONIX) 40 MG tablet Take 40 mg by mouth daily.      Historical Provider, MD  phenazopyridine (PYRIDIUM) 200 MG tablet Take 1 tablet (200 mg total) by mouth 3 (three) times daily. 09/24/16   Rolland Porter, MD  polyethylene glycol powder (GLYCOLAX/MIRALAX) powder Take 17 g by mouth daily.      Historical Provider, MD  ranolazine (RANEXA) 500 MG 12 hr tablet Take 500 mg by mouth 2 (two) times daily.      Historical Provider, MD  rosuvastatin (CRESTOR) 40 MG tablet Take 40 mg by mouth daily.  Historical Provider, MD    ALLERGIES:  Allergies  Allergen Reactions  . Atromid S [Clofibrate]     unknown  . Avelox [Moxifloxacin Hydrochloride]     whoosy and out of head  . Nitrofuran Derivatives     unknown  . Other     tetrosin allergy Unknown   . Penicillins Hives and Itching  . Sulfa Antibiotics Swelling    SOCIAL HISTORY:  Social History  Substance Use Topics  . Smoking status: Former Games developermoker  . Smokeless tobacco: Never Used  . Alcohol use No    FAMILY HISTORY: No family history on file.  EXAM: BP 179/88 (BP Location: Right Arm)   Pulse 72   Temp 97.6 F (36.4 C) (Oral)   Resp 18   Ht 4\' 11"  (1.499 m)   Wt 105 lb (47.6 kg)   SpO2 95%   BMI 21.21 kg/m  CONSTITUTIONAL:  Alert and oriented and responds appropriately to questions. Well-appearing; well-nourished HEAD: Normocephalic EYES: Conjunctivae clear, PERRL, EOMI ENT: normal nose; no rhinorrhea; moist mucous membranes NECK: Supple, no meningismus, no nuchal rigidity, no LAD  CARD: RRR; S1 and S2 appreciated; no murmurs, no clicks, no rubs, no gallops RESP: Normal chest excursion without splinting or tachypnea; breath sounds clear and equal bilaterally; no wheezes, no rhonchi, no rales, no hypoxia or respiratory distress, speaking full sentences ABD/GI: Normal bowel sounds; non-distended; soft, non-tender, no rebound, no guarding, no peritoneal signs, no hepatosplenomegaly BACK:  The back appears normal and is non-tender to palpation, there is no CVA tenderness EXT: Normal ROM in all joints; non-tender to palpation; no edema; normal capillary refill; no cyanosis, no calf tenderness or swelling    SKIN: Normal color for age and race; warm; no rash NEURO: Moves all extremities equally, sensation to light touch intact diffusely,Possible mild right-sided facial droop on exam but otherwise cranial nerves II through XII intact, normal speech without dysarthria or aphasia, no dysmetria to finger-nose testing bilaterally PSYCH: The patient's mood and manner are appropriate. Grooming and personal hygiene are appropriate.  MEDICAL DECISION MAKING: Patient here with slurred speech and right-sided facial droop. He states that symptoms have improved and almost completely resolved. Went to bed at 8 PM feeling well and woke up around 2:30 to 3 AM with symptoms. Patient not a TPA candidate given stroke scale is only a possible one for mild residual right-sided facial droop. Also not TPA candidate given symptoms are improving. Discussed this with patient and daughter who agree. We'll obtain labs, urine, EKG and head CT. I feel she will need admission for TIA workup given her risk factors.  ED PROGRESS: 7:45 AM  Pt's workup has  been unremarkable. Discussed with internal medicine resident who will see patient and place admission orders. Patient and family have been updated with this plan. Will admit for TIA workup. Pt is on ASA and plavix.   I reviewed all nursing notes, vitals, pertinent old records, EKGs, labs, imaging (as available).    EKG Interpretation  Date/Time:  Sunday November 05 2016 04:41:59 EST Ventricular Rate:  72 PR Interval:    QRS Duration: 94 QT Interval:  435 QTC Calculation: 477 R Axis:   -33 Text Interpretation:  Sinus rhythm Left axis deviation Anteroseptal infarct, age indeterminate No old tracing to compare Confirmed by Ibtisam Benge,  DO, Rumi Taras [phone]([id number]) on 11/05/2016 5:12:50 AM         Layla MawKristen N Prudence Heiny, DO 11/05/16 [id number]

## 2016-11-05 NOTE — H&P (Signed)
Date: 11/05/2016               Patient Name:  Melissa Cardenas MRN: [id number]  DOB: 1924-08-25 Age / Sex: 83 y.o., female   PCP: Rocky Morelobert Rostand, MD         Medical Service: Internal Medicine Teaching Service         Attending Physician: Dr. Doneen PoissonLawrence Klima, MD    First Contact: Dr. Ladona Ridgelaylor Pager: [phone]  Second Contact: Dr. Allena KatzPatel Pager: [phone]       After Hours (After 5p/  First Contact Pager: [phone]  weekends / holidays): Second Contact Pager: [phone]   Chief Complaint: facial droop and slurred speech  History of Present Illness: Melissa McalpineHalina Rennaker is a 83 y.o. female with a h/o arthritis, HTN, HLD, CAD s/p CABG, and thyroid disease who present to the emergency with slurred speech and right-sided facial droop. Patient woke up at 3 AM to get a glass of water and noticed that water was dribbling down the left side of her face. She looked in the mirror and felt she had left-sided facial droop. She at this time felt like she had slurred speech. She called her daughter who noticed slurred speech over the phone and appreciated aleft sided facial droop when she saw her mother. EMS was then called and she was taken to the ED. Patient states that symptoms resolved during the ride to the ED.   Patient is in overall great health and able to perform all ADLs independently. Patient denied any numbness or tingling, headaches, or sudden vision changes.   Meds:  Current Meds  Medication Sig  . aspirin EC 81 MG tablet Take 81 mg by mouth daily.    . carvedilol (COREG) 6.25 MG tablet Take 3.125 mg by mouth 2 (two) times daily with a meal.   . Hyoscyamine Sulfate SL 0.125 MG SUBL Place 0.125 mg under the tongue as needed (for stomach).   . Multiple Vitamin (MULTIVITAMIN WITH MINERALS) TABS tablet Take 1 tablet by mouth daily.  . Multiple Vitamins-Minerals (PRESERVISION AREDS PO) Take 1 tablet by mouth daily.  . nitroGLYCERIN (NITROSTAT) 0.4 MG SL tablet Place 0.4 mg under the tongue every 5 (five)  minutes as needed for chest pain.  . polyethylene glycol powder (GLYCOLAX/MIRALAX) powder Take 17 g by mouth daily.    . ranolazine (RANEXA) 500 MG 12 hr tablet Take 500 mg by mouth 2 (two) times daily.    . rosuvastatin (CRESTOR) 40 MG tablet Take 40 mg by mouth daily.       Allergies: Allergies as of 11/05/2016 - Review Complete 11/05/2016  Allergen Reaction Noted  . Atromid s [clofibrate]  06/25/2011  . Avelox [moxifloxacin hydrochloride]  06/25/2011  . Nitrofuran derivatives  06/25/2011  . Other  06/25/2011  . Penicillins Hives and Itching 06/25/2011  . Sulfa antibiotics Swelling 06/25/2011  . Tetracycline Rash 09/22/2015   Past Medical History:  Diagnosis Date  . Arthritis   . Blood transfusion   . Heart attack   . Hypertension   . Thyroid disease     Family History:  Family History  Problem Relation Age of Onset  . Heart attack Son     2 sons passed from heart attack  . Hyperlipidemia Daughter     Social History:  Social History   Social History  . Marital status: Widowed    Spouse name: N/A  . Number of children: N/A  . Years of education: N/A   Occupational History  .  Not on file.   Social History Main Topics  . Smoking status: Former Smoker    Types: Cigarettes    Start date: 1945    Quit date: 1977  . Smokeless tobacco: Never Used     Comment: 12-14 cigarettes per day from age [age]  . Alcohol use No     Comment: Used to drink occasional glass of wine  . Drug use: No  . Sexual activity: Not on file   Other Topics Concern  . Not on file   Social History Narrative  . No narrative on file    Review of Systems: A complete ROS was negative except as per HPI.   Physical Exam: Blood pressure 145/68, pulse 60, temperature 97.6 F (36.4 C), resp. rate 15, height 4\' 11"  (1.499 m), weight 47.6 kg (105 lb), SpO2 97 %.   Physical Exam  Constitutional: She is oriented to person, place, and time. She appears well-developed and well-nourished.    HENT:  Head: Normocephalic and atraumatic.  Eyes: EOM are normal. Pupils are equal, round, and reactive to light.  Cardiovascular: Normal rate, regular rhythm and normal heart sounds.   Pulmonary/Chest: Effort normal and breath sounds normal.  Abdominal: Soft. Bowel sounds are normal. She exhibits no distension. There is no tenderness.  Musculoskeletal: She exhibits no edema.  Neurological: She is alert and oriented to person, place, and time. No cranial nerve deficit.  No dysmetria on finger to nose. Equal strength in both upper and lower extremities bilaterally.   CBC Latest Ref Rng & Units 11/05/2016 11/05/2016  WBC 4.0 - 10.5 K/uL - 4.9  Hemoglobin 12.0 - 15.0 g/dL 11.2(L) 11.1(L)  Hematocrit 36.0 - 46.0 % 33.0(L) 32.8(L)  Platelets 150 - 400 K/uL - 196   CMP Latest Ref Rng & Units 11/05/2016 11/05/2016  Glucose 65 - 99 mg/dL 161(W) 960(A)  BUN 6 - 20 mg/dL 13 12  Creatinine 5.40 - 1.00 mg/dL 9.81 1.91  Sodium 478 - 145 mmol/L 137 135  Potassium 3.5 - 5.1 mmol/L 3.9 3.9  Chloride 101 - 111 mmol/L 101 100(L)  CO2 22 - 32 mmol/L - 25  Calcium 8.9 - 10.3 mg/dL - 9.1  Total Protein 6.5 - 8.1 g/dL - 6.4(L)  Total Bilirubin 0.3 - 1.2 mg/dL - 0.8  Alkaline Phos 38 - 126 U/L - 49  AST 15 - 41 U/L - 28  ALT 14 - 54 U/L - 18   Drugs of Abuse     Component Value Date/Time   LABOPIA NONE DETECTED 11/05/2016 0522   COCAINSCRNUR NONE DETECTED 11/05/2016 0522   LABBENZ NONE DETECTED 11/05/2016 0522   AMPHETMU NONE DETECTED 11/05/2016 0522   THCU NONE DETECTED 11/05/2016 0522   LABBARB NONE DETECTED 11/05/2016 0522    Urinalysis    Component Value Date/Time   COLORURINE STRAW (A) 11/05/2016 0523   APPEARANCEUR CLEAR 11/05/2016 0523   LABSPEC 1.005 11/05/2016 0523   PHURINE 8.0 11/05/2016 0523   GLUCOSEU NEGATIVE 11/05/2016 0523   HGBUR NEGATIVE 11/05/2016 0523   BILIRUBINUR NEGATIVE 11/05/2016 0523   KETONESUR NEGATIVE 11/05/2016 0523   PROTEINUR NEGATIVE 11/05/2016 0523    NITRITE NEGATIVE 11/05/2016 0523   LEUKOCYTESUR NEGATIVE 11/05/2016 0523   CBG (last 3)   Recent Labs  11/05/16 0530  GLUCAP 103*   H1AC: Pending  EKG: Sinus rhythm, Q waves in V1-V2 suggestive of prior septal infarct   CT  IMPRESSION: Atrophy and small vessel disease similar to priors. No acute intracranial findings.  Electronically Signed   By: Elsie Stain M.D.   On: 11/05/2016 07:24  Assessment & Plan by Problem: Active Problems:   TIA (transient ischemic attack)  Ardel Jagger is a 83 y.o. female with a h/o arthritis, HTN, HLD, CAD s/p CABG, and thyroid disease who present to the emergency with slurred speech and right-sided facial droop. Symptoms most likely from TIA vs CVA. Based on HPI, most likely TIA. Will undergo TIA/CVA w/u.   Slurred Speech & Facial Droop 2/2 Possible TIA Patient presented with slurred speech and facial droop. CT scan did not show any intracranial abnormalities. Symptoms could be from TIA vs CVA. Alternatively, she was found to have BP of 210/109 with EMS. Symptoms could be from a hypertensive emergency. This is less likely, as BP seem to normalize in the emergency department. Based on HPI, symptoms most likely from TIA. She is being admitted for stroke work up. CT head was negative for any acute hemorrhage, will undergo MRI/MRA. Will cont home statin and ASA and will restart Plavix, which she took in the past, but has since not taken.  -- monitor with telemetry -- neuro checks Q2H for 12 hours, then Q4H afterwards -- lipid panel -- A1c -- MRI/MRA -- ordered Echo -- cont home ASA 81 mg QD, Rosuvastatin 40 mg QD -- start Clopidogrel 75 mg QD  HTN  Current guidelines state that in the setting of acute ischemic stroke antihypertensive treatment is warranted in patients with systolic blood pressure greater than 220 mm Hg, receiving thrombolytic therapy, or with concomitant medical issues. Will hold BP medications until MRI. -- allow for  permissive HTN w/ goal < 220/110 until MRI results -- hold home Carvedilol  DVT/PE prophylaxis: SQH FEN/GI: Normal diet Code: Partial, CPR but no intubation Dispo: Admit patient to Observation with expected length of stay less than 2 midnights.  Signed: Caryn Bee, Medical Student 11/05/2016, 9:55 AM  Pager: [phone]

## 2016-11-05 NOTE — ED Notes (Signed)
Nurse will draw labs. 

## 2016-11-05 NOTE — H&P (Signed)
Date: 11/05/2016               Patient Name:  Melissa Cardenas MRN: [id number]  DOB: 01-28-1924 Age / Sex: 83 y.o., female   PCP: Rocky Morel, MD         Medical Service: Internal Medicine Teaching Service         Attending Physician: Dr. Doneen Poisson, MD    First Contact: Dr. Thomasene Lot Pager: [phone]  Second Contact: Dr. Darreld Mclean Pager: [phone]       After Hours (After 5p/  First Contact Pager: [phone]  weekends / holidays): Second Contact Pager: [phone]   Chief Complaint: Slurred speech  History of Present Illness: The patient is a 84 year old female with a medical history of hypertension, thyroid disease, hyperlipidemia and coronary artery disease status post CABG who presents to the emergency department with slurred speech and right-sided facial droop. The patient states that she woke up this morning to have a glass of water and felt like it was dribbling down the left side of her face. When she looked in the mirror she thought she had a left-sided facial droop. She called her daughter. She was also having speech difficulties. Her daughter states that talking with her over the phone her mother did sound slightly different. She then called EMS who brought the patient to the [facility] emergency department. Overall, the patient is in great state of health. She is highly functional and is able to perform all of her ADLs independently. She is active in her healthcare. At the time of this event the patient was on daily aspirin but no other antiplatelet therapy.  In the emergency department the patient was afebrile and hemodynamically stable. Neurological examination did not reveal any focal cranial nerve deficits. Laboratory evaluation showed anemia with a hemoglobin of 11.1 but was otherwise largely unremarkable. I-STAT troponin negative. CT head without contrast showed atrophy and small vessel disease. No acute intracranial findings. She was then admitted to the [facility]  internal medicine teaching service for further management and evaluation.  Meds:  Current Meds  Medication Sig  . aspirin EC 81 MG tablet Take 81 mg by mouth daily.    . carvedilol (COREG) 6.25 MG tablet Take 3.125 mg by mouth 2 (two) times daily with a meal.   . Hyoscyamine Sulfate SL 0.125 MG SUBL Place 0.125 mg under the tongue as needed (for stomach).   . Multiple Vitamin (MULTIVITAMIN WITH MINERALS) TABS tablet Take 1 tablet by mouth daily.  . Multiple Vitamins-Minerals (PRESERVISION AREDS PO) Take 1 tablet by mouth daily.  . nitroGLYCERIN (NITROSTAT) 0.4 MG SL tablet Place 0.4 mg under the tongue every 5 (five) minutes as needed for chest pain.  . polyethylene glycol powder (GLYCOLAX/MIRALAX) powder Take 17 g by mouth daily.    . ranolazine (RANEXA) 500 MG 12 hr tablet Take 500 mg by mouth 2 (two) times daily.    . rosuvastatin (CRESTOR) 40 MG tablet Take 40 mg by mouth daily.       Allergies: Allergies as of 11/05/2016 - Review Complete 11/05/2016  Allergen Reaction Noted  . Atromid s [clofibrate]  06/25/2011  . Avelox [moxifloxacin hydrochloride]  06/25/2011  . Nitrofuran derivatives  06/25/2011  . Other  06/25/2011  . Penicillins Hives and Itching 06/25/2011  . Sulfa antibiotics Swelling 06/25/2011  . Tetracycline Rash 09/22/2015   Past Medical History:  Diagnosis Date  . Arthritis   . Blood transfusion   . Heart attack   .  Hypertension   . Thyroid disease     Family History: History of hyperlipidemia and heart disease and multiple children. She is unclear of any family history in her parents.   Social History: Former smoker, denies current tobacco or illicit drug use. Has an occasional glass of wine.  Review of Systems: A complete ROS was negative except as per HPI. Denies changes in vision. Denies changes in sensation. Has no other complaints except for as documented in history of present illness.  Physical Exam: Blood pressure 122/67, pulse 60, temperature  97.6 F (36.4 C), resp. rate 16, height 4\' 11"  (1.499 m), weight 105 lb (47.6 kg), SpO2 93 %. Physical Exam  Constitutional: She is oriented to person, place, and time. She appears well-developed and well-nourished.  HENT:  Head: Normocephalic and atraumatic.  Eyes: Pupils are equal, round, and reactive to light.  Cardiovascular: Normal rate and regular rhythm.  Exam reveals no friction rub.   No murmur heard. Respiratory: Effort normal and breath sounds normal.  Breath sounds normal anteriorly  GI: Soft. Bowel sounds are normal. She exhibits no distension. There is no tenderness.  Musculoskeletal: She exhibits no edema.  Neurological: She is alert and oriented to person, place, and time.  No cranial nerve deficit appreciated. No dysmetria on finger-nose-finger. Strength normal and equal bilaterally in the upper and lower extremities.     EKG: Sinus rhythm Left axis deviation Anteroseptal infarct, age indeterminate No old tracing to compare  CXR: Not obtained  Assessment & Plan by Problem: Active Problems:   TIA (transient ischemic attack)  The patient is a 83 year old female with a medical history of hypertension, hyperlipidemia, coronary artery disease and thyroid disease who presents after a self-limited episode of slurred speech and facial droop with a head CT that did not show any acute intracranial abnormality concerning for potential TIA.  # Slurred speech and facial droop The patient presented after a self-limited episode of slurred speech and facial droop. CT scan in the emergency department did not show any acute intracranial abnormality. Per report her blood pressure by EMS was 210/108. Her blood pressure normalized in the emergency department without intervention. She did take her carvedilol before arriving to the emergency department. Currently, the differential diagnosis for the patient's presentation includes stroke, transient ischemic attack, hypertensive emergency or  other etiology. Given that the patient has no residual deficits and a non-concerning CT head stroke is less likely although we will need MRI to r/o ischemia . Also given the patient's blood pressure was entirely normal in the emergency department I favor hypertensive emergency less. Her presentation seems most consistent with TIA. Given her risk factors: hypertension, age, hyperlipidemia and coronary artery disease she will be admitted for TIA/stroke workup. -- MRI/MRA -- TTE -- Lipid panel -- Hemoglobin A1c -- Telemetry -- continue aspirin 81 mg  -- PT/OT/SLP -- We will allow permissive hypertension until MRI with a goal blood pressure of < 220/110  # CAD s/p CABG ## HTN ## HLD The patient has a history of coronary artery disease and hypertension. She is currently normotensive. At home she takes carvedilol 6.25 mg twice a day. We'll restart this medication. -- Carvedilol 6.25 twice a day- HOLD until MRI -- Aspirin 81 mg once daily -- Clopidogrel 75 mg once daily -- Rosuvastatin 40 mg once daily    DVT/PE prophylaxis: Lovenox FEN/GI: Normal diet Code: Partial, CPR but no intubation Dispo: Admit patient to Observation with expected length of stay less than 2 midnights.  Signed:  Thomasene LotJames Maycie Luera, MD 11/05/2016, 8:34 AM  Pager: [phone]

## 2016-11-06 ENCOUNTER — Observation Stay (HOSPITAL_BASED_OUTPATIENT_CLINIC_OR_DEPARTMENT_OTHER): Payer: Medicare Other

## 2016-11-06 DIAGNOSIS — G459 Transient cerebral ischemic attack, unspecified: Secondary | ICD-10-CM

## 2016-11-06 DIAGNOSIS — Z882 Allergy status to sulfonamides status: Secondary | ICD-10-CM

## 2016-11-06 DIAGNOSIS — E7849 Other hyperlipidemia: Secondary | ICD-10-CM

## 2016-11-06 DIAGNOSIS — I10 Essential (primary) hypertension: Secondary | ICD-10-CM

## 2016-11-06 DIAGNOSIS — I110 Hypertensive heart disease with heart failure: Secondary | ICD-10-CM | POA: Diagnosis not present

## 2016-11-06 DIAGNOSIS — I639 Cerebral infarction, unspecified: Secondary | ICD-10-CM | POA: Diagnosis not present

## 2016-11-06 DIAGNOSIS — I5032 Chronic diastolic (congestive) heart failure: Secondary | ICD-10-CM

## 2016-11-06 DIAGNOSIS — Z888 Allergy status to other drugs, medicaments and biological substances status: Secondary | ICD-10-CM

## 2016-11-06 DIAGNOSIS — Z880 Allergy status to penicillin: Secondary | ICD-10-CM

## 2016-11-06 DIAGNOSIS — I638 Other cerebral infarction: Secondary | ICD-10-CM

## 2016-11-06 DIAGNOSIS — Z881 Allergy status to other antibiotic agents status: Secondary | ICD-10-CM | POA: Diagnosis not present

## 2016-11-06 LAB — ECHOCARDIOGRAM COMPLETE
HEIGHTINCHES: 59 in
Weight: 1680 oz

## 2016-11-06 LAB — HEMOGLOBIN A1C
Hgb A1c MFr Bld: 5.4 % (ref 4.8–5.6)
Mean Plasma Glucose: 108 mg/dL

## 2016-11-06 LAB — URINE CULTURE: Special Requests: NORMAL

## 2016-11-06 MED ORDER — CLOPIDOGREL BISULFATE 75 MG PO TABS
75.0000 mg | ORAL_TABLET | Freq: Every day | ORAL | Status: DC
  Administered 2016-11-06: 75 mg via ORAL
  Filled 2016-11-06 (×2): qty 1

## 2016-11-06 MED ORDER — CLOPIDOGREL BISULFATE 75 MG PO TABS: 75.0000 mg | ORAL_TABLET | Freq: Every day | ORAL | 1 refills | Status: DC

## 2016-11-06 NOTE — Progress Notes (Signed)
*  PRELIMINARY RESULTS* Vascular Ultrasound Carotid Duplex (Doppler) has been completed.  Preliminary findings: Bilateral 1-39% ICA stenosis, antegrade vertebral flow.  Enlarged thyroid bilaterally, right greater than left.   Melissa FischerCharlotte C Amadeus Oyama 11/06/2016, 12:34 PM

## 2016-11-06 NOTE — Progress Notes (Signed)
Counseled patient on new medication (plavix) regarding administration, indication, and potential side effects.  Allisa Einspahr and Johnnette BarriosSarah Bledsoe, PharmD Candidates [address] [employer], [id number]

## 2016-11-06 NOTE — H&P (Signed)
Internal Medicine Attending Admission Note Date: 11/06/2016  Patient name: Melissa Cardenas Medical record number: [id number] Date of birth: 05/02/1924 Age: 83 y.o. Gender: female  I saw and evaluated the patient. I reviewed the resident's note and I agree with the resident's findings and plan as documented in the resident's note.  Chief Complaint(s): Transient slurred speech and right facial droop.  History - key components related to admission:  Melissa Cardenas is a 83 year old woman with a history of hypertension, hyperlipidemia, coronary artery disease status post coronary artery bypass grafting, and thyroid disease who is otherwise independent and healthy. She was in her usual state of health until the morning of admission when she will cup to have a glass of water and felt the fluid dribbling out the side of her mouth. When she looked in the mirror she felt she had a facial droop so she called her daughter. Her daughter noticed that her mother sounded differently and called EMS to bring the patient to the emergency department. By the time she arrived to the emergency department her facial droop and slurred speech had resolved and she had no other focal neurologic abnormalities. She has no previous history of symptomatic cerebrovascular accident and takes an aspirin a day. CT scan of the head revealed no acute bleed. She was admitted to the internal medicine teaching service for further evaluation and care. A subsequent MRI/MRA of the head revealed a focal area of subcentimeter acute infarction that was nonhemorrhagic in the left precentral gyrus. The rest of the stroke evaluation including carotid Dopplers and echocardiogram were unrevealing for a source of her acute infarction.  When seen on rounds the morning following admission she was without any complaints and had many excellent questions.  Physical Exam - key components related to admission:  Vitals:   11/06/16 0217 11/06/16 0554 11/06/16  0925 11/06/16 1334  BP: (!) 141/68 (!) 151/71 121/63 121/64  Pulse: 66 71 62 65  Resp: 20 20 20 20   Temp: 97.7 F (36.5 C) 97.6 F (36.4 C) 97.9 F (36.6 C) 98.2 F (36.8 C)  TempSrc: Oral Oral Oral Oral  SpO2: 97% 96% 96% 96%  Weight:      Height:       Gen.: Well-developed, well-nourished, woman lying comfortably in bed in no acute distress. She appears at least 20 years younger than her stated age. She asked excellent questions that showed grade insight. Neuro: Barely if any noticeable right facial droop.  Lab results:  Basic Metabolic Panel:  Recent Labs  69/62/9501/08/12 0449 11/05/16 0516  NA 135 137  K 3.9 3.9  CL 100* 101  CO2 25  --   GLUCOSE 106* 105*  BUN 12 13  CREATININE 0.75 0.70  CALCIUM 9.1  --    Liver Function Tests:  Recent Labs  11/05/16 0449  AST 28  ALT 18  ALKPHOS 49  BILITOT 0.8  PROT 6.4*  ALBUMIN 3.3*   CBC:  Recent Labs  11/05/16 0449 11/05/16 0516  WBC 4.9  --   NEUTROABS 3.2  --   HGB 11.1* 11.2*  HCT 32.8* 33.0*  MCV 88.4  --   PLT 196  --    CBG:  Recent Labs  11/05/16 0530  GLUCAP 103*   Hemoglobin A1C:  Recent Labs  11/05/16 0944  HGBA1C 5.4   Fasting Lipid Panel:  Recent Labs  11/05/16 0944  CHOL 156  HDL 52  LDLCALC 94  TRIG 48  CHOLHDL 3.0   Coagulation:  Recent  Labs  11/05/16 0449  INR 1.09   Urine Drug Screen:  Negative for opiates, cocaine, benzodiazepines, amphetamines, THC, and barbiturates.  Urinalysis:  Clear, straw, specific gravity 1.005, pH 8.0, negative nitrite, negative leukocytes.  Imaging results:  Ct Head Wo Contrast  Result Date: 11/05/2016 CLINICAL DATA:  Patient woke up with slurred speech.  Drooling. EXAM: CT HEAD WITHOUT CONTRAST TECHNIQUE: Contiguous axial images were obtained from the base of the skull through the vertex without intravenous contrast. COMPARISON:  CT head 06/25/2011. FINDINGS: Brain: No evidence for acute infarction, hemorrhage, mass lesion,  hydrocephalus, or extra-axial fluid. Mild atrophy. Hypoattenuation of white matter suggesting small vessel disease. Vascular: Dense calcification in the carotid siphons. No signs of emergent large vessel occlusion. Skull: Normal. Negative for fracture or focal lesion. Sinuses/Orbits: No acute finding. Other: None. IMPRESSION: Atrophy and small vessel disease similar to priors. No acute intracranial findings. Electronically Signed   By: Elsie Stain M.D.   On: 11/05/2016 07:24   Mr Brain Wo Contrast  Result Date: 11/05/2016 CLINICAL DATA:  RIGHT-sided weakness and slurred speech. This began earlier today. History of hypertension. EXAM: MRI HEAD WITHOUT CONTRAST MRA HEAD WITHOUT CONTRAST TECHNIQUE: Multiplanar, multiecho pulse sequences of the brain and surrounding structures were obtained without intravenous contrast. Angiographic images of the head were obtained using MRA technique without contrast. COMPARISON:  CT head earlier today was within normal limits. FINDINGS: MRI HEAD FINDINGS Brain: Focal area of restricted diffusion, subcentimeter size, affecting the LEFT posterior frontal cortex, precentral gyrus, image 37 series 4/image 10 series 9 consistent with a small acute infarct in the motor strip. No hemorrhage, mass lesion, hydrocephalus, or extra-axial fluid. Generalized atrophy. Mild chronic microvascular ischemic change. Chronic lacunar infarct RIGHT caudate. Vascular: Moderate dolichoectasia, but patent flow voids throughout. Skull and upper cervical spine: Partial empty sella. Normal cerebellar tonsils. Normal marrow signal. Severe cervical spondylosis, evaluated in the sagittal plane only, with degenerative subluxation, osteophytic spurring, as well as pannus surrounding the odontoid. Moderate to severe spinal stenosis may be present at C2-3, C3-4, C4-5 and/or C5-6. Sinuses/Orbits: Negative. Other: None. MRA HEAD FINDINGS Dolichoectatic but widely patent or upper cervical, petrous, cavernous, and  supraclinoid internal carotid artery segments. Widely patent and symmetric anterior and middle cerebral arteries both proximal and distal. Basilar artery widely patent with both vertebrals contributing, codominant. Balanced contribution to the RIGHT PCA from prominent RIGHT PCom. No intracranial stenosis or aneurysm. IMPRESSION: Focal area of subcentimeter acute infarction, nonhemorrhagic, LEFT precentral gyrus. Atrophy and small vessel disease. No intracranial stenosis, dissection, or occlusion is evident. Advanced cervical spondylosis.  See discussion above. Electronically Signed   By: Elsie Stain M.D.   On: 11/05/2016 12:43   Mr Maxine Glenn Head/brain ZO Cm  Result Date: 11/05/2016 CLINICAL DATA:  RIGHT-sided weakness and slurred speech. This began earlier today. History of hypertension. EXAM: MRI HEAD WITHOUT CONTRAST MRA HEAD WITHOUT CONTRAST TECHNIQUE: Multiplanar, multiecho pulse sequences of the brain and surrounding structures were obtained without intravenous contrast. Angiographic images of the head were obtained using MRA technique without contrast. COMPARISON:  CT head earlier today was within normal limits. FINDINGS: MRI HEAD FINDINGS Brain: Focal area of restricted diffusion, subcentimeter size, affecting the LEFT posterior frontal cortex, precentral gyrus, image 37 series 4/image 10 series 9 consistent with a small acute infarct in the motor strip. No hemorrhage, mass lesion, hydrocephalus, or extra-axial fluid. Generalized atrophy. Mild chronic microvascular ischemic change. Chronic lacunar infarct RIGHT caudate. Vascular: Moderate dolichoectasia, but patent flow voids throughout. Skull and upper cervical  spine: Partial empty sella. Normal cerebellar tonsils. Normal marrow signal. Severe cervical spondylosis, evaluated in the sagittal plane only, with degenerative subluxation, osteophytic spurring, as well as pannus surrounding the odontoid. Moderate to severe spinal stenosis may be present at C2-3,  C3-4, C4-5 and/or C5-6. Sinuses/Orbits: Negative. Other: None. MRA HEAD FINDINGS Dolichoectatic but widely patent or upper cervical, petrous, cavernous, and supraclinoid internal carotid artery segments. Widely patent and symmetric anterior and middle cerebral arteries both proximal and distal. Basilar artery widely patent with both vertebrals contributing, codominant. Balanced contribution to the RIGHT PCA from prominent RIGHT PCom. No intracranial stenosis or aneurysm. IMPRESSION: Focal area of subcentimeter acute infarction, nonhemorrhagic, LEFT precentral gyrus. Atrophy and small vessel disease. No intracranial stenosis, dissection, or occlusion is evident. Advanced cervical spondylosis.  See discussion above. Electronically Signed   By: Elsie Stain M.D.   On: 11/05/2016 12:43   Other results:  EKG: Normal sinus rhythm at 72 bpm, normal axis, normal intervals, septal Q waves, no LVH by voltage, delayed R wave progression, no ST or T-wave changes. No comparisons available.  Assessment & Plan by Problem:  Ms. Engebretsen is a 83 year old woman with a history of hypertension, hyperlipidemia, coronary artery disease status post coronary artery bypass grafting, and thyroid disease who is otherwise independent and healthy. She was in her usual state of health until the morning of admission when she will cup to have a glass of water and felt the fluid dribbling out the side of her mouth. When she looked in the mirror she felt she had a facial droop so she called her daughter. Her daughter noticed that her mother sounded differently. By the time she arrived at the emergency department she no longer had any focal neurologic symptoms. Evaluation revealed a focal area of subcentimeter acute infarction that was nonhemorrhagic and in the left precentral gyrus. This was despite taking daily aspirin. She does not have diabetes, is on a high intensity statin, and has excellent blood pressure even with [place] holding her  antihypertensives for permissive hypertension. An echocardiogram did not demonstrate a cardioembolic source and carotid Dopplers were relatively unremarkable.  1) Acute left precentral gyrus CVA: Likely secondary to small vessel disease. Failed aspirin therapy. She had been on Plavix before and tolerated it relatively well. We will therefore stop the aspirin and start Plavix 75 mg by mouth daily. We will restart her blood pressure medication upon discharge. She is already on a high intensity statin and this will be continued. There is no need to treat diabetes as she does not have it.  2) Disposition: She will be discharged home today with follow-up in her primary care provider's office.

## 2016-11-06 NOTE — Progress Notes (Signed)
   Subjective: No acute events overnight. Patient continues to do well. She is ready to go home. She understands that she had a stroke and will need to be on antiplatelet therapy going forward. She has minor right-sided facial droop. She has no speech abnormalities. She had several questions this morning and all were answered to her satisfaction. She is in no pain. She has no new symptoms. We'll plan for discharge this afternoon following her echocardiogram and Doppler.  Objective:  Vital signs in last 24 hours: Vitals:   11/06/16 0003 11/06/16 0217 11/06/16 0554 11/06/16 0925  BP: (!) 170/75 (!) 141/68 (!) 151/71 121/63  Pulse: 68 66 71 62  Resp: 20 20 20 20   Temp: 97.6 F (36.4 C) 97.7 F (36.5 C) 97.6 F (36.4 C) 97.9 F (36.6 C)  TempSrc: Oral Oral Oral Oral  SpO2: 97% 97% 96% 96%  Weight:      Height:       Physical Exam  Constitutional: She is oriented to person, place, and time. She appears well-developed and well-nourished.  HENT:  Head: Normocephalic and atraumatic.  Cardiovascular: Normal rate and regular rhythm.  Exam reveals no friction rub.   No murmur heard. Respiratory: Effort normal and breath sounds normal. No respiratory distress. She has no wheezes.  Musculoskeletal: She exhibits no edema.  Neurological: She is alert and oriented to person, place, and time.  Minimal if any right-sided facial droop.     Assessment/Plan:  Active Problems:   CVA (cerebral vascular accident) [facility](HCC)  The patient is a 83 year old female with a medical history of hypertension, hyperlipidemia, coronary artery disease and thyroid disease who presents after a self-limited episode of slurred speech and facial droop with a head CT that did not show any acute intracranial abnormality concerning for potential TIA.  # Acute infarct of the left precentral gyrus Patient presented with facial droop and slurred speech which has resolved. CT head without contrast showed no bleed. MRI showed  acute infarct in the left precentral gyrus. She has no residual deficits. MRA without severe stenosis. Hemoglobin A1c normal. Lipid panel normal. The patient had a stroke while on aspirin so she is failed primary prevention with aspirin and for secondary prevention we'll switch to clopidogrel. We will await results of her TTE and carotid Dopplers today and plan for discharge this afternoon. -- TTE- follow-up results -- Discontinue aspirin and start clopidogrel 75 mg once daily -- Lipid panel- normal, already on high intensity statin, continue therapy -- Hemoglobin A1c- normal -- Telemetry -- PT/OT/SLP- no recommendations -- We will allow permissive hypertension until MRI with a goal blood pressure of < 220/110  # CAD s/p CABG ## HTN ## HLD The patient has a history of coronary artery disease and hypertension. She is currently normotensive. At home she takes carvedilol 6.25 mg twice a day. We'll restart this medication. -- Carvedilol 6.25 twice a day- HOLD until MRI -- Discontinue aspirin -- Clopidogrel 75 mg once daily -- Rosuvastatin 40 mg once daily   DVT/PE prophylaxis: Lovenox FEN/GI: Normal diet Code: Partial, CPR but no intubation Dispo: Admit patient to Observation with expected length of stay less than 2 midnights.  Dispo: Anticipated discharge today pending TTE.  Thomasene LotJames Khambrel Amsden, MD 11/06/2016, 11:24 AM Pager: [phone]

## 2016-11-06 NOTE — Care Management Note (Signed)
Case Management Note  Patient Details  Name: Melissa Cardenas MRN: [id number] Date of Birth: 11-11-1923  Subjective/Objective:                    Action/Plan: Pt discharging home with self care. No f/u per PT/OT and no DME needs. Pt has insurance and PCP. No further needs per CM.   Expected Discharge Date:  11/06/16               Expected Discharge Plan:  Home/Self Care  In-House Referral:     Discharge planning Services     Post Acute Care Choice:    Choice offered to:     DME Arranged:    DME Agency:     HH Arranged:    HH Agency:     Status of Service:  Completed, signed off  If discussed at [employer] Length of Stay Meetings, dates discussed:    Additional Comments:  Kermit BaloKelli F Trevonne Nyland, RN 11/06/2016, 5:05 PM

## 2016-11-06 NOTE — Progress Notes (Signed)
   Subjective:  No acute events overnight. Patient denies any symptoms from stroke. Patient asked  Questions about her treatment and MRI. Informed her of her results. Patient wants to go home. Will discharge home today pending TTE and Carotid Doppler results.   Objective:  Vital signs in last 24 hours: Vitals:   11/06/16 0003 11/06/16 0217 11/06/16 0554 11/06/16 0925  BP: (!) 170/75 (!) 141/68 (!) 151/71 121/63  Pulse: 68 66 71 62  Resp: 20 20 20 20   Temp: 97.6 F (36.4 C) 97.7 F (36.5 C) 97.6 F (36.4 C) 97.9 F (36.6 C)  TempSrc: Oral Oral Oral Oral  SpO2: 97% 97% 96% 96%  Weight:      Height:       Physical Exam  Constitutional: She is oriented to person, place, and time. She appears well-developed and well-nourished.  Cardiovascular: Normal rate, regular rhythm and normal heart sounds.   Pulmonary/Chest: Effort normal and breath sounds normal.  Musculoskeletal: She exhibits no edema.  Neurological: She is alert and oriented to person, place, and time.   Lipid Panel     Component Value Date/Time   CHOL 156 11/05/2016 0944   TRIG 48 11/05/2016 0944   HDL 52 11/05/2016 0944   CHOLHDL 3.0 11/05/2016 0944   VLDL 10 11/05/2016 0944   LDLCALC 94 11/05/2016 0944   H1Ac: 5.4  MRI/MRA head IMPRESSION: Focal area of subcentimeter acute infarction, nonhemorrhagic, LEFT precentral gyrus.  Atrophy and small vessel disease.  No intracranial stenosis, dissection, or occlusion is evident.  Advanced cervical spondylosis.  See discussion above.  Assessment/Plan:  Active Problems:   CVA (cerebral vascular accident) (HCC)  Melissa Cardenas is a 83 y.o. female with a h/o arthritis, HTN, HLD, CAD s/p CABG, and thyroid disease who present to the emergency with slurred speech and right-sided facial droop. Symptoms most likely from TIA vs CVA. Based on HPI, most likely TIA. Will undergo TIA/CVA w/u.   Slurred Speech & Facial Droop 2/2 CVA CT scan did not show any intracranial  abnormalities. MRA/MRI showed focal area of subcentimeter acute infarction, nonhemorrhagic, left precentral gyrus. Patient had CVA.  Patient will get a TTE and Carotid Doppler today to investigate etiology. Despite no stenosis on MRA/MRI, etiology is most likely not thromboembolic given the focal nature. Since she failed CVA prevention on ASA, will switch to Plavix.  -- monitor with telemetry -- TTE today -- Carotid Doppler today -- discontinue home ASA 81 mg QD -- cont home Rosuvastatin 40 mg QD -- cont Clopidogrel 75 mg QD  HTN  Current guidelines state that in the setting of acute ischemic stroke antihypertensive treatment is warranted in patients with systolic blood pressure greater than 220 mm Hg, receiving thrombolytic therapy, or with concomitant medical issues. As described above, patient has had a stroke. Will hold BP meds for today and instruct patient to restart them tomorrow.  -- allow for permissive HTN w/ goal < 220/110 for 48-72 hours -- hold home Carvedilol today, have patient restart tomorrow.    Fluids: None Diet: Regular Diet DVT Prophylaxis: SQH Code Status: PARTIAL  Dispo: Anticipated discharge today pending TTE and Carotid Doppler.   Caryn BeeJohn A Abb Gobert, Medical Student 11/06/2016, 11:21 AM Pager: [phone]

## 2016-11-06 NOTE — Discharge Summary (Signed)
Name: Melissa Cardenas MRN: [id number] DOB: Jan 14, 1924 83 y.o. PCP: Rocky Morel, MD  Date of Admission: 11/05/2016  4:24 AM Date of Discharge: 11/06/2016 Attending Physician: Doneen Poisson, MD  Discharge Diagnosis: 1. 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 HFpEF 3. HTN Active Problems:   CVA (cerebral vascular accident) [facility])   Discharge Medications: Allergies as of 11/06/2016      Reactions   Atromid S [clofibrate]    unknown   Avelox [moxifloxacin Hydrochloride]    whoosy and out of head   Nitrofuran Derivatives    unknown   Other    tetrosin allergy Unknown   Penicillins Hives, Itching   Sulfa Antibiotics Swelling   Tetracycline Rash      Medication List    STOP taking these medications   aspirin EC 81 MG tablet     TAKE these medications   carvedilol 6.25 MG tablet Commonly known as:  COREG Take 3.125 mg by mouth 2 (two) times daily with a meal.   ciprofloxacin 500 MG tablet Commonly known as:  CIPRO Take 1 tablet (500 mg total) by mouth 2 (two) times daily.   clopidogrel 75 MG tablet Commonly known as:  PLAVIX Take 1 tablet (75 mg total) by mouth daily. Start taking on:  11/07/2016   Hyoscyamine Sulfate SL 0.125 MG Subl Place 0.125 mg under the tongue as needed (for stomach).   multivitamin with minerals Tabs tablet Take 1 tablet by mouth daily.   nitroGLYCERIN 0.4 MG SL tablet Commonly known as:  NITROSTAT Place 0.4 mg under the tongue every 5 (five) minutes as needed for chest pain.   phenazopyridine 200 MG tablet Commonly known as:  PYRIDIUM Take 1 tablet (200 mg total) by mouth 3 (three) times daily.   polyethylene glycol powder powder Commonly known as:  GLYCOLAX/MIRALAX Take 17 g by mouth daily.   PRESERVISION AREDS PO Take 1 tablet by mouth daily.   ranolazine 500 MG 12 hr tablet Commonly known as:  RANEXA Take 500 mg by mouth 2 (two) times daily.   rosuvastatin 40 MG tablet Commonly known as:  CRESTOR Take 40 mg by  mouth daily.       Disposition and follow-up:   [name] was discharged from [facility] in Good condition.  At the hospital follow up visit please address:  1.  Please review the patient's echocardiogram and decide if further workup is necessary. Please ensure she is only taking clopidogrel and not also taking aspirin.  2.  Labs / imaging needed at time of follow-up: None  3.  Pending labs/ test needing follow-up: Follow-up official read of carotid Doppler.  Follow-up Appointments: 1. She will need to make an appointment with her primary care physician in the next 1-2 weeks.  Hospital Course by problem list: Active Problems:   CVA (cerebral vascular accident) (HCC)   1. 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 The patient is in extremely well functioning 83 year old female who woke up with these symptoms. She called EMS who brought her to the emergency department. Once evaluated in the emergency department the patient was afebrile and hemodynamiclly stable. Her neurological examination was normal. CT head without contrast showed no intracranial abnormality. MRI brain showed acute infarct of the left precentral gyrus. MRA showed no severe stenosis. Hemoglobin A1c normal. Lipid panel normal. The patient was on aspirin when  she had the stroke thus she has failed to this as a prevention strategy. To complete her stroke workup carotid Dopplers were ordered and the preliminary read shows 1-39% stenosis. She will need follow-up with a final read. She also had a transthoracic echocardiogram which showed new diastolic dysfunction but no clot. It was thought that her stroke was secondary to microvascular disease. She will be discharged on clopidogrel which she will take once daily. The patient had no focal neurological deficits  following the stroke and had entirely recovered by the time she was discharged home. Although the official read of the patient's carotid Doppler had not resulted her preliminary read showed no severe stenosis. The patient stated that she was going to leave that evening and requested to be discharged. I thought this was appropriate given her low risk Dopplers and she will need follow-up with these in the outpatient setting for the final report.  2. HFpEF and Grade III DD As part of her stroke evaluation the patient had a transthoracic echocardiogram. The estimate of her left ventricular ejection fraction was 60-65%. The echo demonstrated grade 3 diastolic dysfunction. Doppler parameters were consistent with elevated ventricular end-diastolic filling pressure. No evidence of clot was identified. She will not need full anticoagulation but will be discharged on clopidogrel as above. She will need follow-up for this new grade 3 diastolic dysfunction documented on her echocardiogram. She will be discharged on her home antihypertensive and cardiac medications.  3. Hypertension The patient has a past medical history of hypertension along with coronary artery disease. Given that she had an acute infarct we allowed for permissive hypertension with a goal blood pressure of less than 220/110. Because of this we held her home antihypertensive medications. She remained normotensive or borderline hypertensive on the inpatient setting. At the time of discharge she was normotensive. She will resume her antihypertensive medications tomorrow. This will be approximately 48-72 hours after the initial onset of her symptoms. We have made no additional changes to her antihypertensive medication regimen. Discharge Vitals:   BP 121/64 (BP Location: Right Arm)   Pulse 65   Temp 98.2 F (36.8 C) (Oral)   Resp 20   Ht 4\' 11"  (1.499 m)   Wt 105 lb (47.6 kg)   SpO2 96%   BMI 21.21 kg/m   Pertinent Labs, Studies, and Procedures:   1. CT head without contrast-atrophy and small vessel disease similar to priors, no acute intracranial finding 2. MRI brain-focal area of acute infarction, nonhemorrhagic, left precentral gyrus 3. MRA head-no intracranial stenosis, dissection or occlusion 4. Transthoracic echocardiogram-no evidence of clot, grade 3 diastolic dysfunction noted 5. Vascular carotid duplex-preliminary result-bilateral 1-39% stenosis with a bilaterally enlarged thyroid gland. She will need follow-up with the official read of this.  Discharge Instructions: Discharge Instructions    Diet - low sodium heart healthy    Complete by:  As directed    Discharge instructions    Complete by:  As directed    As you know you had a small stroke.  The echocardiogram of your heart did not show a clot. It did show something called decreased diastolic dysfunction. For this you should make an appointment with your primary care provider and follow-up with him for her in the next 1-2 weeks.  To prevent another stroke we have started you on a medication called Plavix. Also, since we have started Plavix we have stopped aspirin. Please continue to take the Plavix every day going forward. Taking this medication will be the  best thing you can do to prevent a stroke in the future. Please continue to take your other medications as prescribed.  It was our pleasure taking care of you in the hospital. I hope you continue to recover and feel great.   Increase activity slowly    Complete by:  As directed       Signed: Thomasene Lot, MD 11/06/2016, 4:48 PM   Pager: [phone]

## 2016-11-06 NOTE — Progress Notes (Signed)
Pt. With discharge orders. Discharge instructions completed with the patient and daughter verbalizing understanding.

## 2016-11-06 NOTE — Progress Notes (Signed)
  Echocardiogram 2D Echocardiogram has been performed.  Nolon RodBrown, Tony 11/06/2016, 1:00 PM

## 2016-11-06 NOTE — Evaluation (Addendum)
Occupational Therapy Evaluation Patient Details Name: Melissa Cardenas MRN: [id number] DOB: 04-26-1924 Today's Date: 11/06/2016    History of Present Illness 83 year old female with a medical history of hypertension, thyroid disease, hyperlipidemia and coronary artery disease status post CABG who presents to the emergency department with slurred speech and right-sided facial droop.  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   Clinical Impression   PTA, pt was independent with assistive devices for all ADL, IADL, and functional mobility. Pt lives alone and utilizes effective compensatory strategies for daily occupations due to arthritis and R shoulder injury at baseline. No visual, cognitive, or praxis changes from baseline. She reports that all symptoms have resolved. Pt currently able to complete all ADL with modified independence. Educated pt on energy conservation techniques and safety in the home and she verbalizes and demonstrates understanding. No further acute OT needs identified. No OT follow-up recommended. OT will sign off.    Follow Up Recommendations  No OT follow up;Supervision - Intermittent    Equipment Recommendations  None recommended by OT    Recommendations for Other Services       Precautions / Restrictions Precautions Precautions: None Restrictions Weight Bearing Restrictions: No      Mobility Bed Mobility Overal bed mobility: Independent                Transfers Overall transfer level: Independent Equipment used: None                  Balance Overall balance assessment: No apparent balance deficits (not formally assessed)                                          ADL Overall ADL's : Modified independent                                       General ADL Comments: Increased time utilized and compensatory strategies for arthritis but able to complete all without assistance.      Vision  Vision Assessment?: No apparent visual deficits Additional Comments: Able to functionally use central and peripheral vision during ADL tasks.   Perception     Praxis Praxis Praxis tested?: Within functional limits    Pertinent Vitals/Pain Pain Assessment: No/denies pain     Hand Dominance Right   Extremity/Trunk Assessment Upper Extremity Assessment Upper Extremity Assessment: RUE deficits/detail;LUE deficits/detail RUE Deficits / Details: Decreased shoulder ROM and strength due to previous injury. Pt with WNL elbow, wrist, hand strength. Arthritis at baseline and uses compensatory techniques. LUE Deficits / Details: Arthritis at baseline and uses compensatory techniques.   Lower Extremity Assessment Lower Extremity Assessment: Overall WFL for tasks assessed   Cervical / Trunk Assessment Cervical / Trunk Assessment: Kyphotic   Communication Communication Communication: No difficulties   Cognition Arousal/Alertness: Awake/alert Behavior During Therapy: WFL for tasks assessed/performed Overall Cognitive Status: Within Functional Limits for tasks assessed                     General Comments       Exercises       Shoulder Instructions      Home Living Family/patient expects to be discharged to:: Private residence Living Arrangements: Alone Available Help at Discharge: Family;Available PRN/intermittently (daughter close by) Type of Home: Apartment Home Access:  Level entry     Home Layout: One level     Bathroom Shower/Tub: Producer, television/film/videoWalk-in shower   Bathroom Toilet: Standard     Home Equipment: Environmental consultantWalker - 4 wheels;Cane - single point;Electric scooter          Prior Functioning/Environment Level of Independence: Independent with assistive device(s)        Comments: Lives alone. Independent. Drives. Does all her own cooking, housekeeping and shopping. Occassional use of cane. RW uses for increased ambulation in community due to chronic back pain. Electric  scooter used for big trips [place]        OT Problem List: Decreased strength;Decreased range of motion;Impaired UE functional use   OT Treatment/Interventions:      OT Goals(Current goals can be found in the care plan section) Acute Rehab OT Goals Patient Stated Goal: to go home and knit OT Goal Formulation: With patient Time For Goal Achievement: 11/13/16 Potential to Achieve Goals: Good  OT Frequency:     Barriers to D/C:            Co-evaluation              End of Session    Activity Tolerance: Patient tolerated treatment well Patient left: in bed;with call bell/phone within reach (Sitting at EOB. Signed off as independent in room by nursing)   Time: [id number] OT Time Calculation (min): 11 min Charges:  OT General Charges $OT Visit: 1 Procedure OT Evaluation $OT Eval Moderate Complexity: 1 Procedure G-Codes: OT G-codes **NOT FOR INPATIENT CLASS** Functional Assessment Tool Used: clinical judgment Functional Limitation: Self care Self Care Current Status (V4098(G8987): 0 percent impaired, limited or restricted Self Care Goal Status (J1914(G8988): 0 percent impaired, limited or restricted Self Care Discharge Status (N8295(G8989): 0 percent impaired, limited or restricted  Sentara Bayside HospitalCharity A Anguel Delapena, OTR/L [phone] 11/06/2016, 9:21 AM

## 2016-11-06 NOTE — Care Management Note (Signed)
Case Management Note  Patient Details  Name: Melissa Cardenas MRN: [id number] Date of Birth: 09-15-1924  Subjective/Objective:       Patient was admitted with CVA. Lives at home alone and is independent with ADLs at baseline. CM will follow for discharge needs pending PT/OT evals and physician orders.              Action/Plan:   Expected Discharge Date:                  Expected Discharge Plan:     In-House Referral:     Discharge planning Services     Post Acute Care Choice:    Choice offered to:     DME Arranged:    DME Agency:     HH Arranged:    HH Agency:     Status of Service:     If discussed at [employer] Length of Stay Meetings, dates discussed:    Additional Comments:  Anda KraftRobarge, Roscoe Witts C, RN 11/06/2016, 3:10 PM

## 2016-11-07 LAB — VAS US CAROTID
LEFT ECA DIAS: -8 cm/s
LEFT VERTEBRAL DIAS: -16 cm/s
Left CCA dist dias: -17 cm/s
Left CCA dist sys: -63 cm/s
Left CCA prox dias: 19 cm/s
Left CCA prox sys: 105 cm/s
Left ICA dist dias: -35 cm/s
Left ICA dist sys: -130 cm/s
Left ICA prox dias: 18 cm/s
Left ICA prox sys: 79 cm/s
RCCADSYS: -90 cm/s
RCCAPDIAS: -16 cm/s
RIGHT ECA DIAS: -8 cm/s
RIGHT VERTEBRAL DIAS: -16 cm/s
Right CCA prox sys: -101 cm/s

## 2017-03-05 ENCOUNTER — Other Ambulatory Visit: Payer: Self-pay | Admitting: Internal Medicine

## 2018-05-15 ENCOUNTER — Encounter (HOSPITAL_BASED_OUTPATIENT_CLINIC_OR_DEPARTMENT_OTHER): Payer: Self-pay

## 2018-05-15 ENCOUNTER — Other Ambulatory Visit: Payer: Self-pay

## 2018-05-15 ENCOUNTER — Emergency Department (HOSPITAL_BASED_OUTPATIENT_CLINIC_OR_DEPARTMENT_OTHER): Payer: Medicare Other

## 2018-05-15 ENCOUNTER — Emergency Department (HOSPITAL_BASED_OUTPATIENT_CLINIC_OR_DEPARTMENT_OTHER)
Admission: EM | Admit: 2018-05-15 | Discharge: 2018-05-15 | Disposition: A | Discharge status: Home / Self Care | Payer: Medicare Other | Attending: Emergency Medicine | Admitting: Emergency Medicine

## 2018-05-15 DIAGNOSIS — Y929 Unspecified place or not applicable: Secondary | ICD-10-CM | POA: Diagnosis not present

## 2018-05-15 DIAGNOSIS — Z87891 Personal history of nicotine dependence: Secondary | ICD-10-CM | POA: Diagnosis not present

## 2018-05-15 DIAGNOSIS — Z79899 Other long term (current) drug therapy: Secondary | ICD-10-CM | POA: Insufficient documentation

## 2018-05-15 DIAGNOSIS — Y999 Unspecified external cause status: Secondary | ICD-10-CM | POA: Diagnosis not present

## 2018-05-15 DIAGNOSIS — W01190A Fall on same level from slipping, tripping and stumbling with subsequent striking against furniture, initial encounter: Secondary | ICD-10-CM | POA: Insufficient documentation

## 2018-05-15 DIAGNOSIS — S0003XA Contusion of scalp, initial encounter: Secondary | ICD-10-CM | POA: Insufficient documentation

## 2018-05-15 DIAGNOSIS — Z7902 Long term (current) use of antithrombotics/antiplatelets: Secondary | ICD-10-CM | POA: Insufficient documentation

## 2018-05-15 DIAGNOSIS — Y9301 Activity, walking, marching and hiking: Secondary | ICD-10-CM | POA: Insufficient documentation

## 2018-05-15 DIAGNOSIS — I10 Essential (primary) hypertension: Secondary | ICD-10-CM | POA: Diagnosis not present

## 2018-05-15 DIAGNOSIS — M25532 Pain in left wrist: Secondary | ICD-10-CM | POA: Insufficient documentation

## 2018-05-15 DIAGNOSIS — S0990XA Unspecified injury of head, initial encounter: Secondary | ICD-10-CM | POA: Diagnosis present

## 2018-05-15 DIAGNOSIS — W19XXXA Unspecified fall, initial encounter: Secondary | ICD-10-CM

## 2018-05-15 NOTE — ED Provider Notes (Signed)
[facility] EMERGENCY DEPARTMENT Provider Note   CSN: [id number] Arrival date & time: 05/15/18  1205     History   Chief Complaint Chief Complaint  Patient presents with  . Fall    HPI Melissa Cardenas is a 84 y.o. female.  HPI 84 year old female presents emergency department complaints of close head injury on the right side of her head.  She fell 2 hours ago after catching her right foot on a chair and falling and striking the right side of her head on the chair.  She is on Xarelto.  She has a small hematoma to her right parietal scalp.  Denies neck pain.  No weakness of her arms or legs.  Denies pain in her lower extremities.  Denies chest and abdominal pain.  Symptoms are mild in severity.  No altered mental status.  No fevers or chills.  No nausea or vomiting.  Patient and family also report fall 5 days ago which point she fell onto an outstretched left hand and reports mild left wrist pain since that fall.  No other complaints.   Past Medical History:  Diagnosis Date  . Arthritis   . Blood transfusion   . Heart attack (HCC)   . Hypertension   . Thyroid disease     Patient Active Problem List   Diagnosis Date Noted  . Cerebrovascular accident (CVA) (HCC)   . Other hyperlipidemia   . Essential hypertension   . CVA (cerebral vascular accident) (HCC) 11/05/2016    Past Surgical History:  Procedure Laterality Date  . BREAST BIOPSY    . cabg  1990  . CORONARY ARTERY BYPASS GRAFT       OB History   None      Home Medications    Prior to Admission medications   Medication Sig Start Date End Date Taking? Authorizing Provider  carvedilol (COREG) 6.25 MG tablet Take 3.125 mg by mouth 2 (two) times daily with a meal.     [provider]  ciprofloxacin (CIPRO) 500 MG tablet Take 1 tablet (500 mg total) by mouth 2 (two) times daily. Patient not taking: Reported on 11/05/2016 09/24/16   Rolland PorterJames, Mark, MD  clopidogrel (PLAVIX) 75 MG tablet Take 1 tablet  (75 mg total) by mouth daily. 11/07/16   Thomasene Lotaylor, James, MD  Hyoscyamine Sulfate SL 0.125 MG SUBL Place 0.125 mg under the tongue as needed (for stomach).     [provider]  Multiple Vitamin (MULTIVITAMIN WITH MINERALS) TABS tablet Take 1 tablet by mouth daily.    [provider]  Multiple Vitamins-Minerals (PRESERVISION AREDS PO) Take 1 tablet by mouth daily.    [provider]  nitroGLYCERIN (NITROSTAT) 0.4 MG SL tablet Place 0.4 mg under the tongue every 5 (five) minutes as needed for chest pain.    [provider]  phenazopyridine (PYRIDIUM) 200 MG tablet Take 1 tablet (200 mg total) by mouth 3 (three) times daily. Patient not taking: Reported on 11/05/2016 09/24/16   Rolland PorterJames, Mark, MD  polyethylene glycol powder (GLYCOLAX/MIRALAX) powder Take 17 g by mouth daily.      [provider]  ranolazine (RANEXA) 500 MG 12 hr tablet Take 500 mg by mouth 2 (two) times daily.      [provider]  rosuvastatin (CRESTOR) 40 MG tablet Take 40 mg by mouth daily.      [provider]    Family History Family History  Problem Relation Age of Onset  . Heart attack Son  2 sons passed from heart attack  . Hyperlipidemia Daughter     Social History Social History   Tobacco Use  . Smoking status: Former Smoker    Types: Cigarettes    Start date: 1945    Last attempt to quit: 1977    Years since quitting: 42.6  . Smokeless tobacco: Never Used  . Tobacco comment: 12-14 cigarettes per day from age [age]  Substance Use Topics  . Alcohol use: No  . Drug use: No     Allergies   Atromid s [clofibrate]; Avelox [moxifloxacin hydrochloride]; Nitrofuran derivatives; Other; Penicillins; Sulfa antibiotics; and Tetracycline   Review of Systems Review of Systems  All other systems reviewed and are negative.    Physical Exam Updated Vital Signs BP (!) 207/113 (BP Location: Right Arm)   Pulse 89   Temp 97.8 F (36.6 C) (Oral)    Resp 20   Ht 4\' 10"  (1.473 m)   Wt 50.3 kg   SpO2 98%   BMI 23.18 kg/m   Physical Exam  Constitutional: She is oriented to person, place, and time. She appears well-developed and well-nourished.  HENT:  Head: Normocephalic.  Right parietal scalp hematoma without laceration  Eyes: EOM are normal.  Neck: Normal range of motion. Neck supple.  No C-spine tenderness  Cardiovascular: Normal rate and regular rhythm.  Pulmonary/Chest: Effort normal and breath sounds normal. She exhibits no tenderness.  Abdominal: She exhibits no distension.  Musculoskeletal: Normal range of motion.  Full range of motion bilateral hips, knees, ankles.  Full range of motion bilateral wrists, elbows, shoulders.  Mild tenderness left wrist without obvious deformity.  Normal left radial pulse.  Neurological: She is alert and oriented to person, place, and time.  Psychiatric: She has a normal mood and affect.  Nursing note and vitals reviewed.    ED Treatments / Results  Labs (all labs ordered are listed, but only abnormal results are displayed) Labs Reviewed - No data to display  EKG None  Radiology Dg Wrist Complete Left  Result Date: 05/15/2018 CLINICAL DATA:  Left wrist pain since a fall 5 days ago. EXAM: LEFT WRIST - COMPLETE 3+ VIEW COMPARISON:  None. FINDINGS: There is no evidence of fracture or dislocation. There are severe arthritic changes at the first [facility] joint. Chondrocalcinosis at the radiocarpal joint. Diffuse osteopenia. Old avulsion of the ulnar styloid. IMPRESSION: No acute bone abnormality.  Arthritic changes as described. Electronically Signed   By: Francene Boyers M.D.   On: 05/15/2018 13:19   Ct Head Wo Contrast  Result Date: 05/15/2018 CLINICAL DATA:  Head trauma, anticoagulation EXAM: CT HEAD WITHOUT CONTRAST TECHNIQUE: Contiguous axial images were obtained from the base of the skull through the vertex without intravenous contrast. COMPARISON:  MRI head 11/05/2016.  CT head 11/05/2016  FINDINGS: Brain: Mild atrophy and mild chronic microvascular ischemic change in the white matter. Negative for acute infarct, hemorrhage, mass. No midline shift. Vascular: Normal arterial flow voids Skull: Negative for skull fracture. Right convexity scalp contusion with soft tissue swelling Sinuses/Orbits: Negative Other: None IMPRESSION: No acute abnormality.  Right frontal convexity scalp contusion. Electronically Signed   By: Marlan Palau M.D.   On: 05/15/2018 13:17    Procedures Procedures (including critical care time)  Medications Ordered in ED Medications - No data to display   Initial Impression / Assessment and Plan / ED Course  I have reviewed the triage vital signs and the nursing notes.  Pertinent labs & imaging results that were available  during my care of the patient were reviewed by me and considered in my medical decision making (see chart for details).     Mild close head injury.  Use of anticoagulants.  Head CT imaging without acute traumatic pathology.  Left wrist imaging without acute fracture.  C-spine nontender.  C-spine cleared by Nexus criteria.  Discharged home in good condition  Final Clinical Impressions(s) / ED Diagnoses   Final diagnoses:  Closed head injury, initial encounter  Left wrist pain  Fall, initial encounter    ED Discharge Orders    None       Azalia Bilisampos, Melicia Esqueda, MD 05/15/18 1355

## 2018-05-15 NOTE — ED Triage Notes (Addendum)
Pt states she "hooked my foot" and fell approx 1030am into heavy chair-struck back/right side of head-no break in skin-no LOC-to triage with own walker-daughter with pt-added that she also fell 5 days ago and having pain to left wrist

## 2018-07-03 ENCOUNTER — Encounter (HOSPITAL_BASED_OUTPATIENT_CLINIC_OR_DEPARTMENT_OTHER): Payer: Self-pay

## 2018-07-03 ENCOUNTER — Emergency Department (HOSPITAL_BASED_OUTPATIENT_CLINIC_OR_DEPARTMENT_OTHER): Payer: Medicare Other

## 2018-07-03 ENCOUNTER — Other Ambulatory Visit: Payer: Self-pay

## 2018-07-03 ENCOUNTER — Emergency Department (HOSPITAL_BASED_OUTPATIENT_CLINIC_OR_DEPARTMENT_OTHER)
Admission: EM | Admit: 2018-07-03 | Discharge: 2018-07-03 | Disposition: A | Discharge status: Home / Self Care | Payer: Medicare Other | Attending: Emergency Medicine | Admitting: Emergency Medicine

## 2018-07-03 DIAGNOSIS — R5383 Other fatigue: Secondary | ICD-10-CM

## 2018-07-03 DIAGNOSIS — Z79899 Other long term (current) drug therapy: Secondary | ICD-10-CM | POA: Insufficient documentation

## 2018-07-03 DIAGNOSIS — I4891 Unspecified atrial fibrillation: Secondary | ICD-10-CM | POA: Diagnosis not present

## 2018-07-03 DIAGNOSIS — Z7902 Long term (current) use of antithrombotics/antiplatelets: Secondary | ICD-10-CM | POA: Insufficient documentation

## 2018-07-03 DIAGNOSIS — I110 Hypertensive heart disease with heart failure: Secondary | ICD-10-CM | POA: Diagnosis not present

## 2018-07-03 DIAGNOSIS — I509 Heart failure, unspecified: Secondary | ICD-10-CM | POA: Insufficient documentation

## 2018-07-03 DIAGNOSIS — R600 Localized edema: Secondary | ICD-10-CM | POA: Insufficient documentation

## 2018-07-03 DIAGNOSIS — Z87891 Personal history of nicotine dependence: Secondary | ICD-10-CM | POA: Insufficient documentation

## 2018-07-03 DIAGNOSIS — Z8673 Personal history of transient ischemic attack (TIA), and cerebral infarction without residual deficits: Secondary | ICD-10-CM | POA: Diagnosis not present

## 2018-07-03 DIAGNOSIS — R531 Weakness: Secondary | ICD-10-CM | POA: Insufficient documentation

## 2018-07-03 HISTORY — DX: Unspecified atrial fibrillation: I48.91

## 2018-07-03 HISTORY — DX: Cerebral infarction, unspecified: I63.9

## 2018-07-03 HISTORY — DX: Heart failure, unspecified: I50.9

## 2018-07-03 HISTORY — DX: Anemia, unspecified: D64.9

## 2018-07-03 LAB — COMPREHENSIVE METABOLIC PANEL
ALT: 20 U/L (ref 0–44)
ANION GAP: 8 (ref 5–15)
AST: 29 U/L (ref 15–41)
Albumin: 3.6 g/dL (ref 3.5–5.0)
Alkaline Phosphatase: 55 U/L (ref 38–126)
BILIRUBIN TOTAL: 0.7 mg/dL (ref 0.3–1.2)
BUN: 14 mg/dL (ref 8–23)
CALCIUM: 8.8 mg/dL — AB (ref 8.9–10.3)
CO2: 25 mmol/L (ref 22–32)
CREATININE: 0.71 mg/dL (ref 0.44–1.00)
Chloride: 99 mmol/L (ref 98–111)
Glucose, Bld: 106 mg/dL — ABNORMAL HIGH (ref 70–99)
Potassium: 4.1 mmol/L (ref 3.5–5.1)
SODIUM: 132 mmol/L — AB (ref 135–145)
TOTAL PROTEIN: 6.7 g/dL (ref 6.5–8.1)

## 2018-07-03 LAB — URINALYSIS, ROUTINE W REFLEX MICROSCOPIC
BILIRUBIN URINE: NEGATIVE
Glucose, UA: NEGATIVE mg/dL
HGB URINE DIPSTICK: NEGATIVE
KETONES UR: NEGATIVE mg/dL
Leukocytes, UA: NEGATIVE
NITRITE: NEGATIVE
Protein, ur: NEGATIVE mg/dL
pH: 7 (ref 5.0–8.0)

## 2018-07-03 LAB — CBC WITH DIFFERENTIAL/PLATELET
Abs Immature Granulocytes: 0.02 10*3/uL (ref 0.00–0.07)
BASOS ABS: 0 10*3/uL (ref 0.0–0.1)
BASOS PCT: 1 %
EOS ABS: 0.2 10*3/uL (ref 0.0–0.5)
EOS PCT: 3 %
HCT: 32.4 % — ABNORMAL LOW (ref 36.0–46.0)
HEMOGLOBIN: 10.2 g/dL — AB (ref 12.0–15.0)
Immature Granulocytes: 0 %
LYMPHS PCT: 20 %
Lymphs Abs: 1.2 10*3/uL (ref 0.7–4.0)
MCH: 29.4 pg (ref 26.0–34.0)
MCHC: 31.5 g/dL (ref 30.0–36.0)
MCV: 93.4 fL (ref 80.0–100.0)
MONO ABS: 0.6 10*3/uL (ref 0.1–1.0)
Monocytes Relative: 10 %
NRBC: 0 % (ref 0.0–0.2)
Neutro Abs: 4 10*3/uL (ref 1.7–7.7)
Neutrophils Relative %: 66 %
Platelets: 248 10*3/uL (ref 150–400)
RBC: 3.47 MIL/uL — AB (ref 3.87–5.11)
RDW: 14.9 % (ref 11.5–15.5)
WBC: 6 10*3/uL (ref 4.0–10.5)

## 2018-07-03 LAB — TROPONIN I

## 2018-07-03 MED ORDER — CARVEDILOL 6.25 MG PO TABS
6.2500 mg | ORAL_TABLET | Freq: Two times a day (BID) | ORAL | Status: DC
  Filled 2018-07-03: qty 1

## 2018-07-03 NOTE — ED Notes (Signed)
Skin warm and dry. Pt states the feel weak. Alert, speech clear. No pain at present.

## 2018-07-03 NOTE — ED Notes (Signed)
ED Provider at bedside. 

## 2018-07-03 NOTE — ED Triage Notes (Signed)
Pt c/o fatigue-recent dx of anemia-unable to find bleeding source with endoscopy-pt stopped xarelto-EKG app reads pt in Afib-pt NAD-denies pain- to tx area in w/c

## 2018-07-03 NOTE — ED Notes (Signed)
Pt on monitor 

## 2018-07-03 NOTE — Discharge Instructions (Addendum)
The cause of your symptoms was not identified today. Your hemoglobin was 10.2 and your sodium is 132. You are in atrial fibrillation today with a controlled heart rate. Continue to take your current medications as directed, particularly your coreg.  Please follow-up with your family doctor for recheck. Get rechecked immediately if you develop any new or concerning symptoms.

## 2018-07-03 NOTE — ED Provider Notes (Signed)
[facility] EMERGENCY DEPARTMENT Provider Note   CSN: [id number] Arrival date & time: 07/03/18  1609     History   Chief Complaint Chief Complaint  Patient presents with  . Fatigue    HPI Melissa Cardenas is a 84 y.o. female.  The history is provided by the patient and a relative. No language interpreter was used.   Melissa Cardenas is a 84 y.o. female who presents to the Emergency Department complaining of fatigue.  She presents to the ED with complaints of feeling unwell this afternoon.  She reports generalized fatigue/weakness.  Denies fevers, chest pain, sob, n/v/d, abdominal pain, hematochezia or melena.  She has chronic intermittent lower extremity edema - at her baseline.  She was recently admitted to [facility] one week ago for hyponatremia with symptomatic anemia requiring blood transfusion.  She has been home for a week with no difficulties.  Source of bleeding was not identified despite endoscopy.  Her Xarelto was discontinued.  She had transient blurred vision, now resolved.   Past Medical History:  Diagnosis Date  . A-fib (HCC)   . Anemia   . Arthritis   . Blood transfusion   . CHF (congestive heart failure) (HCC)   . Heart attack (HCC)   . Hypertension   . Stroke (HCC)   . Thyroid disease     Patient Active Problem List   Diagnosis Date Noted  . Cerebrovascular accident (CVA) (HCC)   . Other hyperlipidemia   . Essential hypertension   . CVA (cerebral vascular accident) (HCC) 11/05/2016    Past Surgical History:  Procedure Laterality Date  . BREAST BIOPSY    . cabg  1990  . CORONARY ARTERY BYPASS GRAFT       OB History   None      Home Medications    Prior to Admission medications   Medication Sig Start Date End Date Taking? Authorizing Provider  carvedilol (COREG) 6.25 MG tablet Take 3.125 mg by mouth 2 (two) times daily with a meal.     [provider]  ciprofloxacin (CIPRO) 500 MG tablet Take 1 tablet (500 mg total) by mouth 2  (two) times daily. Patient not taking: Reported on 11/05/2016 09/24/16   Rolland Porter, MD  clopidogrel (PLAVIX) 75 MG tablet Take 1 tablet (75 mg total) by mouth daily. 11/07/16   Thomasene Lot, MD  Hyoscyamine Sulfate SL 0.125 MG SUBL Place 0.125 mg under the tongue as needed (for stomach).     [provider]  Multiple Vitamin (MULTIVITAMIN WITH MINERALS) TABS tablet Take 1 tablet by mouth daily.    [provider]  Multiple Vitamins-Minerals (PRESERVISION AREDS PO) Take 1 tablet by mouth daily.    [provider]  nitroGLYCERIN (NITROSTAT) 0.4 MG SL tablet Place 0.4 mg under the tongue every 5 (five) minutes as needed for chest pain.    [provider]  phenazopyridine (PYRIDIUM) 200 MG tablet Take 1 tablet (200 mg total) by mouth 3 (three) times daily. Patient not taking: Reported on 11/05/2016 09/24/16   Rolland Porter, MD  polyethylene glycol powder (GLYCOLAX/MIRALAX) powder Take 17 g by mouth daily.      [provider]  ranolazine (RANEXA) 500 MG 12 hr tablet Take 500 mg by mouth 2 (two) times daily.      [provider]  rosuvastatin (CRESTOR) 40 MG tablet Take 40 mg by mouth daily.      [provider]    Family History Family History  Problem Relation  Age of Onset  . Heart attack Son        2 sons passed from heart attack  . Hyperlipidemia Daughter     Social History Social History   Tobacco Use  . Smoking status: Former Smoker    Types: Cigarettes    Start date: 1945    Last attempt to quit: 1977    Years since quitting: 42.7  . Smokeless tobacco: Never Used  . Tobacco comment: 12-14 cigarettes per day from age [age]  Substance Use Topics  . Alcohol use: No  . Drug use: No     Allergies   Atromid s [clofibrate]; Avelox [moxifloxacin hydrochloride]; Nitrofuran derivatives; Other; Penicillins; Sulfa antibiotics; and Tetracycline   Review of Systems Review of Systems  All other systems reviewed and are  negative.    Physical Exam Updated Vital Signs BP 137/87   Pulse 88   Temp 98.1 F (36.7 C) (Oral)   Resp 18   SpO2 96%   Physical Exam  Constitutional: She is oriented to person, place, and time. She appears well-developed and well-nourished.  HENT:  Head: Normocephalic and atraumatic.  Cardiovascular: Normal rate.  No murmur heard. irregularly irregular  Pulmonary/Chest: Effort normal and breath sounds normal. No respiratory distress.  Abdominal: Soft. There is no tenderness. There is no rebound and no guarding.  Musculoskeletal: She exhibits no tenderness.  Trace edema to LLE, 1+ Pitting edema to RLE  Neurological: She is alert and oriented to person, place, and time.  Skin: Skin is warm and dry.  Psychiatric: She has a normal mood and affect. Her behavior is normal.  Nursing note and vitals reviewed.    ED Treatments / Results  Labs (all labs ordered are listed, but only abnormal results are displayed) Labs Reviewed  COMPREHENSIVE METABOLIC PANEL - Abnormal; Notable for the following components:      Result Value   Sodium 132 (*)    Glucose, Bld 106 (*)    Calcium 8.8 (*)    All other components within normal limits  CBC WITH DIFFERENTIAL/PLATELET - Abnormal; Notable for the following components:   RBC 3.47 (*)    Hemoglobin 10.2 (*)    HCT 32.4 (*)    All other components within normal limits  URINALYSIS, ROUTINE W REFLEX MICROSCOPIC - Abnormal; Notable for the following components:   Specific Gravity, Urine <1.005 (*)    All other components within normal limits  TROPONIN I    EKG EKG Interpretation  Date/Time:  Wednesday July 03 2018 16:20:46 EDT Ventricular Rate:  87 PR Interval:    QRS Duration: 88 QT Interval:  364 QTC Calculation: 438 R Axis:   18 Text Interpretation:  Atrial fibrillation Probable anteroseptal infarct, old Confirmed by Tilden Fossa [phone]) on 07/03/2018 4:35:41 PM   Radiology Dg Chest 2 View  Result Date:  07/03/2018 CLINICAL DATA:  Weakness EXAM: CHEST - 2 VIEW COMPARISON:  06/23/2018 FINDINGS: Cardiac shadow is enlarged. Postsurgical changes are again noted and stable. The lungs are well aerated bilaterally. No focal infiltrate or sizable effusion is seen. No acute bony abnormality is noted. IMPRESSION: No acute abnormality seen. Electronically Signed   By: Alcide Clever M.D.   On: 07/03/2018 17:14    Procedures Procedures (including critical care time)  Medications Ordered in ED Medications  carvedilol (COREG) tablet 6.25 mg (has no administration in time range)     Initial Impression / Assessment and Plan / ED Course  I have reviewed the triage vital signs and  the nursing notes.  Pertinent labs & imaging results that were available during my care of the patient were reviewed by me and considered in my medical decision making (see chart for details).     Should here for evaluation of fatigue and weakness, improved on ED evaluation. Labs demonstrate stable anemia with improved hyponatremia compared to recent hospitalization. She is noted to be in atrial fibrillation with a controlled heart rate. Question if she had a possible episode of rapid atrial fibrillation at home. She is not currently anticoagulated due to high bleeding risk. Presentation is not consistent with ACS, PE, sepsis.  CHA2DS2/VAS Stroke Risk Points      N/A >= 2 Points: High Risk  1 - 1.99 Points: Medium Risk  0 Points: Low Risk     Score: 8   This score determines the patient's risk of having a stroke if the  patient has atrial fibrillation.      This score is not applicable to this patient. Components are not  calculated.      Final Clinical Impressions(s) / ED Diagnoses   Final diagnoses:  Fatigue, unspecified type  Atrial fibrillation, unspecified type [facility])    ED Discharge Orders    None       Tilden Fossa, MD 07/03/18 [phone]

## 2019-03-04 ENCOUNTER — Other Ambulatory Visit: Payer: Self-pay

## 2019-03-04 ENCOUNTER — Emergency Department (HOSPITAL_BASED_OUTPATIENT_CLINIC_OR_DEPARTMENT_OTHER)
Admission: EM | Admit: 2019-03-04 | Discharge: 2019-03-04 | Disposition: A | Discharge status: Home / Self Care | Payer: Medicare Other | Attending: Emergency Medicine | Admitting: Emergency Medicine

## 2019-03-04 ENCOUNTER — Emergency Department (HOSPITAL_BASED_OUTPATIENT_CLINIC_OR_DEPARTMENT_OTHER): Payer: Medicare Other

## 2019-03-04 ENCOUNTER — Encounter (HOSPITAL_BASED_OUTPATIENT_CLINIC_OR_DEPARTMENT_OTHER): Payer: Self-pay

## 2019-03-04 DIAGNOSIS — Z87891 Personal history of nicotine dependence: Secondary | ICD-10-CM | POA: Insufficient documentation

## 2019-03-04 DIAGNOSIS — Z79899 Other long term (current) drug therapy: Secondary | ICD-10-CM | POA: Diagnosis not present

## 2019-03-04 DIAGNOSIS — R0600 Dyspnea, unspecified: Secondary | ICD-10-CM | POA: Insufficient documentation

## 2019-03-04 DIAGNOSIS — Z951 Presence of aortocoronary bypass graft: Secondary | ICD-10-CM | POA: Diagnosis not present

## 2019-03-04 DIAGNOSIS — I509 Heart failure, unspecified: Secondary | ICD-10-CM | POA: Insufficient documentation

## 2019-03-04 DIAGNOSIS — R5383 Other fatigue: Secondary | ICD-10-CM | POA: Diagnosis not present

## 2019-03-04 DIAGNOSIS — I110 Hypertensive heart disease with heart failure: Secondary | ICD-10-CM | POA: Diagnosis present

## 2019-03-04 DIAGNOSIS — R1013 Epigastric pain: Secondary | ICD-10-CM | POA: Diagnosis not present

## 2019-03-04 DIAGNOSIS — Z8673 Personal history of transient ischemic attack (TIA), and cerebral infarction without residual deficits: Secondary | ICD-10-CM | POA: Diagnosis not present

## 2019-03-04 DIAGNOSIS — I4811 Longstanding persistent atrial fibrillation: Secondary | ICD-10-CM

## 2019-03-04 LAB — CBC WITH DIFFERENTIAL/PLATELET
Abs Immature Granulocytes: 0.01 10*3/uL (ref 0.00–0.07)
Basophils Absolute: 0 10*3/uL (ref 0.0–0.1)
Basophils Relative: 1 %
Eosinophils Absolute: 0.1 10*3/uL (ref 0.0–0.5)
Eosinophils Relative: 1 %
HCT: 38.2 % (ref 36.0–46.0)
Hemoglobin: 12.5 g/dL (ref 12.0–15.0)
Immature Granulocytes: 0 %
Lymphocytes Relative: 19 %
Lymphs Abs: 1 10*3/uL (ref 0.7–4.0)
MCH: 30 pg (ref 26.0–34.0)
MCHC: 32.7 g/dL (ref 30.0–36.0)
MCV: 91.6 fL (ref 80.0–100.0)
Monocytes Absolute: 0.4 10*3/uL (ref 0.1–1.0)
Monocytes Relative: 8 %
Neutro Abs: 3.6 10*3/uL (ref 1.7–7.7)
Neutrophils Relative %: 71 %
Platelets: 224 10*3/uL (ref 150–400)
RBC: 4.17 MIL/uL (ref 3.87–5.11)
RDW: 14.5 % (ref 11.5–15.5)
WBC: 5.1 10*3/uL (ref 4.0–10.5)
nRBC: 0 % (ref 0.0–0.2)

## 2019-03-04 LAB — URINALYSIS, ROUTINE W REFLEX MICROSCOPIC
Bilirubin Urine: NEGATIVE
Glucose, UA: NEGATIVE mg/dL
Hgb urine dipstick: NEGATIVE
Ketones, ur: NEGATIVE mg/dL
Leukocytes,Ua: NEGATIVE
Nitrite: NEGATIVE
Protein, ur: NEGATIVE mg/dL
Specific Gravity, Urine: 1.02 (ref 1.005–1.030)
pH: 7.5 (ref 5.0–8.0)

## 2019-03-04 LAB — COMPREHENSIVE METABOLIC PANEL
ALT: 22 U/L (ref 0–44)
AST: 30 U/L (ref 15–41)
Albumin: 3.8 g/dL (ref 3.5–5.0)
Alkaline Phosphatase: 49 U/L (ref 38–126)
Anion gap: 10 (ref 5–15)
BUN: 11 mg/dL (ref 8–23)
CO2: 23 mmol/L (ref 22–32)
Calcium: 9.1 mg/dL (ref 8.9–10.3)
Chloride: 101 mmol/L (ref 98–111)
Creatinine, Ser: 0.6 mg/dL (ref 0.44–1.00)
GFR calc Af Amer: >60 mL/min (ref >60)
GFR calc non Af Amer: >60 mL/min (ref >60)
Glucose, Bld: 112 mg/dL — ABNORMAL HIGH (ref 70–99)
Potassium: 3.8 mmol/L (ref 3.5–5.1)
Sodium: 134 mmol/L — ABNORMAL LOW (ref 135–145)
Total Bilirubin: 1.1 mg/dL (ref 0.3–1.2)
Total Protein: 6.8 g/dL (ref 6.5–8.1)

## 2019-03-04 LAB — TROPONIN I: Troponin I: <0.03 ng/mL (ref <0.03)

## 2019-03-04 NOTE — ED Triage Notes (Signed)
Pt sent from PCP for elevated BP-daughter states pt with "bloated" abd x 4 days-pt with hx of same c/o with elevated BP in the past-pt NAD-steady gait with own rollator

## 2019-03-04 NOTE — ED Notes (Signed)
Pt ambulatory from tx across hall to [facility] and back-states "that wore me out"- DOE noted

## 2019-03-04 NOTE — ED Notes (Signed)
Patient transported to X-ray 

## 2019-03-04 NOTE — ED Provider Notes (Signed)
[facility] EMERGENCY DEPARTMENT Provider Note   CSN: [id number] Arrival date & time: 03/04/19  1132    History   Chief Complaint Chief Complaint  Patient presents with  . Hypertension    HPI Eugenia McalpineHalina Vestal is a 85 y.o. female.     Patient is a 85 year old female with a history of hypertension, atrial fibrillation, CHF, stroke who is presenting today by PCPs office because of hypertension.  Patient states since Saturday she has had feelings of being generally worn out and tired with some exertional dyspnea.  She is also had intermittent epigastric discomfort which she describes as feeling like she needs to eat.  She denies any cough, congestion, vomiting, diarrhea.  She has had no chest pain.  She has not noticed a difference in her stool but states it is always black because she is on iron.  She states in the past when she felt like this she was anemic and losing blood.  She has been taking her blood pressure medication but since Saturday is been more elevated than normal.  She takes chronic medications but also has medication she can take when it is elevated.  She did discuss this at length with her doctor today.  However because her blood pressure was elevated and she was having vague complaints they wanted to ensure there was no other acute issues.   Hypertension     Past Medical History:  Diagnosis Date  . A-fib (HCC)   . Anemia   . Arthritis   . Blood transfusion   . CHF (congestive heart failure) (HCC)   . Heart attack (HCC)   . Hypertension   . Stroke (HCC)   . Thyroid disease     Patient Active Problem List   Diagnosis Date Noted  . Cerebrovascular accident (CVA) (HCC)   . Other hyperlipidemia   . Essential hypertension   . CVA (cerebral vascular accident) (HCC) 11/05/2016    Past Surgical History:  Procedure Laterality Date  . BREAST BIOPSY    . cabg  1990  . CORONARY ARTERY BYPASS GRAFT       OB History   No obstetric history on file.       Home Medications    Prior to Admission medications   Medication Sig Start Date End Date Taking? Authorizing Provider  carvedilol (COREG) 6.25 MG tablet Take 3.125 mg by mouth 2 (two) times daily with a meal.     [provider]  ciprofloxacin (CIPRO) 500 MG tablet Take 1 tablet (500 mg total) by mouth 2 (two) times daily. Patient not taking: Reported on 11/05/2016 09/24/16   Rolland PorterJames, Mark, MD  clopidogrel (PLAVIX) 75 MG tablet Take 1 tablet (75 mg total) by mouth daily. 11/07/16   Thomasene Lotaylor, James, MD  Hyoscyamine Sulfate SL 0.125 MG SUBL Place 0.125 mg under the tongue as needed (for stomach).     [provider]  Multiple Vitamin (MULTIVITAMIN WITH MINERALS) TABS tablet Take 1 tablet by mouth daily.    [provider]  Multiple Vitamins-Minerals (PRESERVISION AREDS PO) Take 1 tablet by mouth daily.    [provider]  nitroGLYCERIN (NITROSTAT) 0.4 MG SL tablet Place 0.4 mg under the tongue every 5 (five) minutes as needed for chest pain.    [provider]  phenazopyridine (PYRIDIUM) 200 MG tablet Take 1 tablet (200 mg total) by mouth 3 (three) times daily. Patient not taking: Reported on 11/05/2016 09/24/16   Rolland PorterJames, Mark, MD  polyethylene glycol powder [facility](GLYCOLAX/MIRALAX) powder  Take 17 g by mouth daily.      [provider]  ranolazine (RANEXA) 500 MG 12 hr tablet Take 500 mg by mouth 2 (two) times daily.      [provider]  rosuvastatin (CRESTOR) 40 MG tablet Take 40 mg by mouth daily.      [provider]    Family History Family History  Problem Relation Age of Onset  . Heart attack Son        2 sons passed from heart attack  . Hyperlipidemia Daughter     Social History Social History   Tobacco Use  . Smoking status: Former Smoker    Types: Cigarettes    Start date: 1945    Last attempt to quit: 1977    Years since quitting: 43.4  . Smokeless tobacco: Never Used  . Tobacco comment: 12-14 cigarettes per  day from age [age]  Substance Use Topics  . Alcohol use: No  . Drug use: No     Allergies   Atromid s [clofibrate]; Avelox [moxifloxacin hydrochloride]; Nitrofuran derivatives; Other; Penicillins; Sulfa antibiotics; and Tetracycline   Review of Systems Review of Systems  All other systems reviewed and are negative.    Physical Exam Updated Vital Signs BP (!) 158/99   Pulse 93   Temp 98.2 F (36.8 C) (Oral)   Resp (!) 23   Ht 4\' 11"  (1.499 m)   Wt 50.3 kg   SpO2 96%   BMI 22.42 kg/m   Physical Exam Vitals signs and nursing note reviewed.  Constitutional:      General: She is not in acute distress.    Appearance: She is well-developed and normal weight.  HENT:     Head: Normocephalic and atraumatic.  Eyes:     Pupils: Pupils are equal, round, and reactive to light.     Comments: Pale conjunctiva  Cardiovascular:     Rate and Rhythm: Normal rate. Rhythm irregularly irregular.     Heart sounds: Normal heart sounds. No murmur. No friction rub.  Pulmonary:     Effort: Pulmonary effort is normal.     Breath sounds: Normal breath sounds. No wheezing or rales.  Abdominal:     General: Bowel sounds are normal. There is no distension.     Palpations: Abdomen is soft.     Tenderness: There is no abdominal tenderness. There is no guarding or rebound.  Musculoskeletal: Normal range of motion.        General: No tenderness.     Comments: No edema  Skin:    General: Skin is warm and dry.     Findings: No rash.  Neurological:     Mental Status: She is alert and oriented to person, place, and time.     Cranial Nerves: No cranial nerve deficit.  Psychiatric:        Behavior: Behavior normal.      ED Treatments / Results  Labs (all labs ordered are listed, but only abnormal results are displayed) Labs Reviewed  COMPREHENSIVE METABOLIC PANEL - Abnormal; Notable for the following components:      Result Value   Sodium 134 (*)    Glucose, Bld 112 (*)    All other  components within normal limits  URINALYSIS, ROUTINE W REFLEX MICROSCOPIC  CBC WITH DIFFERENTIAL/PLATELET  TROPONIN I    EKG EKG Interpretation  Date/Time:  Tuesday March 04 2019 12:03:52 EDT Ventricular Rate:  95 PR Interval:    QRS Duration: 87 QT Interval:  340 QTC Calculation: 428 R Axis:   -84 Text Interpretation:  Atrial fibrillation Left anterior fascicular block Probable anteroseptal infarct, old No significant change since last tracing Confirmed by Gwyneth SproutPlunkett, Dicky Boer ([id number]) on 03/04/2019 1:02:57 PM   Radiology Dg Chest 2 View  Result Date: 03/04/2019 CLINICAL DATA:  Weakness and short of breath EXAM: CHEST - 2 VIEW COMPARISON:  08/05/2018 FINDINGS: Postop CABG with mild cardiac enlargement. Negative for heart failure. Lungs are with are well aerated and clear. IMPRESSION: No active cardiopulmonary disease. Electronically Signed   By: Marlan Palauharles  Clark M.D.   On: 03/04/2019 13:17    Procedures Procedures (including critical care time)  Medications Ordered in ED Medications - No data to display   Initial Impression / Assessment and Plan / ED Course  I have reviewed the triage vital signs and the nursing notes.  Pertinent labs & imaging results that were available during my care of the patient were reviewed by me and considered in my medical decision making (see chart for details).       Patient is a 85 year old female with multiple underlying medical conditions presenting today with 3 days of feeling more fatigued, mild worsening dyspnea on exertion and elevated blood pressure.  Patient denies any chest pain but has had occasional epigastric discomfort.  She has no evidence of fluid overload today to suggest CHF.  She is in atrial fibrillation with a rate between the 70s and low 100s.  She has a known history of A. fib.  Blood pressure here is 158/99 but was also elevated at her doctor's office today.  She does have blood pressure medication at home but they wanted to ensure  there were no other issues going on.  She has a prior history of GI bleeding and anemia.  She states her stool is always black and has not noticed a change.  EKG is unchanged today and no evidence of ACS.  CBC, CMP, troponin and UA are pending.   3:10 PM Labs are within normal limits and reassuring.  Repeat blood pressure is 133/81.  Heart rate has been controlled but wondering if she has been in A. fib longer than normal and that is what is causing her fatigue.  Patient was given her lab results and questions were answered.  She will follow-up with her doctor if she continues to feel tired and was given strict return precautions if she develops unilateral weakness, numbness, speech difficulty, chest pain or any other concerning symptoms.  Final Clinical Impressions(s) / ED Diagnoses   Final diagnoses:  Longstanding persistent atrial fibrillation    ED Discharge Orders    None       Gwyneth SproutPlunkett, Eliza Green, MD 03/04/19 1511

## 2019-03-04 NOTE — Discharge Instructions (Signed)
Continue to take your blood pressure medication as prescribed.  If your heart rate becomes elevated and is not going down you can take an additional half of the pill.  Your blood pressure today is 133/81.  Your heart rate has been below 100.  If you develop chest pain, shortness of breath or any weakness in 1 side of your body please return.  Today your blood counts were normal with no signs of anemia, no evidence of heart attack and kidneys and electrolytes were normal.

## 2019-08-27 ENCOUNTER — Other Ambulatory Visit: Payer: Self-pay

## 2019-08-27 ENCOUNTER — Emergency Department (HOSPITAL_BASED_OUTPATIENT_CLINIC_OR_DEPARTMENT_OTHER): Payer: Medicare Other

## 2019-08-27 ENCOUNTER — Emergency Department (HOSPITAL_BASED_OUTPATIENT_CLINIC_OR_DEPARTMENT_OTHER)
Admission: EM | Admit: 2019-08-27 | Discharge: 2019-08-27 | Disposition: A | Discharge status: Home / Self Care | Payer: Medicare Other | Attending: Emergency Medicine | Admitting: Emergency Medicine

## 2019-08-27 ENCOUNTER — Encounter (HOSPITAL_BASED_OUTPATIENT_CLINIC_OR_DEPARTMENT_OTHER): Payer: Self-pay | Admitting: *Deleted

## 2019-08-27 DIAGNOSIS — I4891 Unspecified atrial fibrillation: Secondary | ICD-10-CM | POA: Insufficient documentation

## 2019-08-27 DIAGNOSIS — U071 COVID-19: Secondary | ICD-10-CM | POA: Diagnosis not present

## 2019-08-27 DIAGNOSIS — I110 Hypertensive heart disease with heart failure: Secondary | ICD-10-CM | POA: Insufficient documentation

## 2019-08-27 DIAGNOSIS — J029 Acute pharyngitis, unspecified: Secondary | ICD-10-CM | POA: Diagnosis present

## 2019-08-27 DIAGNOSIS — Z87891 Personal history of nicotine dependence: Secondary | ICD-10-CM | POA: Diagnosis not present

## 2019-08-27 DIAGNOSIS — I509 Heart failure, unspecified: Secondary | ICD-10-CM | POA: Insufficient documentation

## 2019-08-27 LAB — FERRITIN: Ferritin: 229 ng/mL (ref 11–307)

## 2019-08-27 LAB — SARS CORONAVIRUS 2 AG (30 MIN TAT): SARS Coronavirus 2 Ag: POSITIVE — AB

## 2019-08-27 LAB — CBC WITH DIFFERENTIAL/PLATELET
Abs Immature Granulocytes: 0 10*3/uL (ref 0.00–0.07)
Basophils Absolute: 0 10*3/uL (ref 0.0–0.1)
Basophils Relative: 0 %
Eosinophils Absolute: 0 10*3/uL (ref 0.0–0.5)
Eosinophils Relative: 0 %
HCT: 38.9 % (ref 36.0–46.0)
Hemoglobin: 13.1 g/dL (ref 12.0–15.0)
Immature Granulocytes: 0 %
Lymphocytes Relative: 24 %
Lymphs Abs: 1 10*3/uL (ref 0.7–4.0)
MCH: 29.4 pg (ref 26.0–34.0)
MCHC: 33.7 g/dL (ref 30.0–36.0)
MCV: 87.4 fL (ref 80.0–100.0)
Monocytes Absolute: 0.3 10*3/uL (ref 0.1–1.0)
Monocytes Relative: 8 %
Neutro Abs: 2.9 10*3/uL (ref 1.7–7.7)
Neutrophils Relative %: 68 %
Platelets: 203 10*3/uL (ref 150–400)
RBC: 4.45 MIL/uL (ref 3.87–5.11)
RDW: 15.6 % — ABNORMAL HIGH (ref 11.5–15.5)
WBC: 4.2 10*3/uL (ref 4.0–10.5)
nRBC: 0 % (ref 0.0–0.2)

## 2019-08-27 LAB — LACTATE DEHYDROGENASE: LDH: 193 U/L — ABNORMAL HIGH (ref 98–192)

## 2019-08-27 LAB — COMPREHENSIVE METABOLIC PANEL
ALT: 27 U/L (ref 0–44)
AST: 40 U/L (ref 15–41)
Albumin: 3.7 g/dL (ref 3.5–5.0)
Alkaline Phosphatase: 55 U/L (ref 38–126)
Anion gap: 12 (ref 5–15)
BUN: 9 mg/dL (ref 8–23)
CO2: 23 mmol/L (ref 22–32)
Calcium: 8.6 mg/dL — ABNORMAL LOW (ref 8.9–10.3)
Chloride: 94 mmol/L — ABNORMAL LOW (ref 98–111)
Creatinine, Ser: 0.62 mg/dL (ref 0.44–1.00)
GFR calc Af Amer: >60 mL/min (ref >60)
GFR calc non Af Amer: >60 mL/min (ref >60)
Glucose, Bld: 124 mg/dL — ABNORMAL HIGH (ref 70–99)
Potassium: 3.2 mmol/L — ABNORMAL LOW (ref 3.5–5.1)
Sodium: 129 mmol/L — ABNORMAL LOW (ref 135–145)
Total Bilirubin: 0.8 mg/dL (ref 0.3–1.2)
Total Protein: 7.5 g/dL (ref 6.5–8.1)

## 2019-08-27 LAB — C-REACTIVE PROTEIN: CRP: 4.1 mg/dL — ABNORMAL HIGH (ref <1.0)

## 2019-08-27 LAB — PROCALCITONIN: Procalcitonin: <0.1 ng/mL

## 2019-08-27 LAB — D-DIMER, QUANTITATIVE: D-Dimer, Quant: 0.75 ug/mL-FEU — ABNORMAL HIGH (ref 0.00–0.50)

## 2019-08-27 LAB — TRIGLYCERIDES: Triglycerides: 95 mg/dL (ref <150)

## 2019-08-27 LAB — TROPONIN I (HIGH SENSITIVITY)
Troponin I (High Sensitivity): 26 ng/L — ABNORMAL HIGH (ref <18)
Troponin I (High Sensitivity): 25 ng/L — ABNORMAL HIGH (ref <18)

## 2019-08-27 LAB — FIBRINOGEN: Fibrinogen: 473 mg/dL (ref 210–475)

## 2019-08-27 LAB — LACTIC ACID, PLASMA: Lactic Acid, Venous: 1.3 mmol/L (ref 0.5–1.9)

## 2019-08-27 MED ORDER — ONDANSETRON 4 MG PO TBDP: 4.0000 mg | ORAL_TABLET | Freq: Three times a day (TID) | ORAL | 0 refills | Status: AC | PRN

## 2019-08-27 NOTE — ED Notes (Signed)
97% 02 while ambulating in room 

## 2019-08-27 NOTE — ED Triage Notes (Addendum)
Pt c/o fever cough generalized fatigue and nausea x 6 days. PTA tylenol 1430.

## 2019-08-27 NOTE — Discharge Instructions (Signed)
You were seen in the ED with COVID-19. If your symptoms worsen you need to return to the ED immediately. Take the Zofran as needed for nausea.

## 2019-08-27 NOTE — ED Provider Notes (Signed)
Emergency Department Provider Note   I have reviewed the triage vital signs and the nursing notes.   HISTORY  Chief Complaint Fever   HPI Melissa Cardenas is a 85 y.o. female with multiple comorbidities reviewed below presents to the emergency department with 6 days of sore throat, congestion, diarrhea along with fever.  She had a virtual visit with her primary care doctor on day 1 of illness and was prescribed a Z-Pak which she finished yesterday with no relief in symptoms.  She denies chest pain but has had some developing exertional dyspnea but none at rest.  She has had nonbloody diarrhea that improved with Imodium.  Some nausea noted but no vomiting.  She has had fever with T-max of 101 F this AM.  She called and spoke with the triage nurse from her PCP and was referred to the emergency department.   Past Medical History:  Diagnosis Date  . A-fib (HCC)   . Anemia   . Arthritis   . Blood transfusion   . CHF (congestive heart failure) (HCC)   . Heart attack (HCC)   . Hypertension   . Stroke (HCC)   . Thyroid disease     Patient Active Problem List   Diagnosis Date Noted  . Cerebrovascular accident (CVA) (HCC)   . Other hyperlipidemia   . Essential hypertension   . CVA (cerebral vascular accident) (HCC) 11/05/2016    Past Surgical History:  Procedure Laterality Date  . BREAST BIOPSY    . cabg  1990  . CORONARY ARTERY BYPASS GRAFT      Allergies Atromid s [clofibrate], Avelox [moxifloxacin hydrochloride], Nitrofuran derivatives, Other, Penicillins, Sulfa antibiotics, and Tetracycline  Family History  Problem Relation Age of Onset  . Heart attack Son        2 sons passed from heart attack  . Hyperlipidemia Daughter     Social History Social History   Tobacco Use  . Smoking status: Former Smoker    Types: Cigarettes    Start date: 1945    Quit date: 1977    Years since quitting: 43.9  . Smokeless tobacco: Never Used  . Tobacco comment: 12-14 cigarettes  per day from age [age]  Substance Use Topics  . Alcohol use: No  . Drug use: No    Review of Systems  Constitutional: Fever and chills this morning with generalized weakness.  Eyes: No visual changes. ENT: Positive sore throat. Cardiovascular: Denies chest pain. Respiratory: Positive shortness of breath with exertion only.  Gastrointestinal: No abdominal pain. Positive nausea, no vomiting. Positive diarrhea.  No constipation. Genitourinary: Negative for dysuria. Musculoskeletal: Negative for back pain. Skin: Negative for rash. Neurological: Negative for headaches, focal weakness or numbness.  10-point ROS otherwise negative.  ____________________________________________   PHYSICAL EXAM:  VITAL SIGNS: ED Triage Vitals  Enc Vitals Group     BP 08/27/19 1654 (!) 152/97     Pulse Rate 08/27/19 1654 (!) 106     Resp 08/27/19 1654 18     Temp 08/27/19 1654 98.7 F (37.1 C)     Temp Source 08/27/19 1654 Oral     SpO2 08/27/19 1654 96 %     Weight 08/27/19 1655 162 lb (73.5 kg)     Height 08/27/19 1655 4\' 11"  (1.499 m)   Constitutional: Alert and oriented. Well appearing and in no acute distress. Eyes: Conjunctivae are normal.  Head: Atraumatic. Nose: No congestion/rhinnorhea. Mouth/Throat: Mucous membranes are moist.   Neck: No stridor.   Cardiovascular:  Tachycardia. Good peripheral circulation. Grossly normal heart sounds.   Respiratory: Normal respiratory effort.  No retractions. Lungs CTAB. Gastrointestinal: Soft and nontender. No distention.  Musculoskeletal: No lower extremity tenderness nor edema. No gross deformities of extremities. Neurologic:  Normal speech and language. No gross focal neurologic deficits are appreciated.  Skin:  Skin is warm, dry and intact. No rash noted.  ____________________________________________   LABS (all labs ordered are listed, but only abnormal results are displayed)  Labs Reviewed  CULTURE, BLOOD (ROUTINE X 2) - Abnormal;  Notable for the following components:      Result Value   Culture   (*)    Value: STAPHYLOCOCCUS SPECIES (COAGULASE NEGATIVE) THE SIGNIFICANCE OF ISOLATING THIS ORGANISM FROM A SINGLE SET OF BLOOD CULTURES WHEN MULTIPLE SETS ARE DRAWN IS UNCERTAIN. PLEASE NOTIFY THE MICROBIOLOGY DEPARTMENT WITHIN ONE WEEK IF SPECIATION AND SENSITIVITIES ARE REQUIRED. Performed at [facility], [place] [address], [place], [place] [address]    All other components within normal limits  SARS CORONAVIRUS 2 AG (30 MIN TAT) - Abnormal; Notable for the following components:   SARS Coronavirus 2 Ag POSITIVE (*)    All other components within normal limits  CBC WITH DIFFERENTIAL/PLATELET - Abnormal; Notable for the following components:   RDW 15.6 (*)    All other components within normal limits  COMPREHENSIVE METABOLIC PANEL - Abnormal; Notable for the following components:   Sodium 129 (*)    Potassium 3.2 (*)    Chloride 94 (*)    Glucose, Bld 124 (*)    Calcium 8.6 (*)    All other components within normal limits  D-DIMER, QUANTITATIVE (NOT AT [facility]) - Abnormal; Notable for the following components:   D-Dimer, Quant 0.75 (*)    All other components within normal limits  LACTATE DEHYDROGENASE - Abnormal; Notable for the following components:   LDH 193 (*)    All other components within normal limits  C-REACTIVE PROTEIN - Abnormal; Notable for the following components:   CRP 4.1 (*)    All other components within normal limits  TROPONIN I (HIGH SENSITIVITY) - Abnormal; Notable for the following components:   Troponin I (High Sensitivity) 26 (*)    All other components within normal limits  TROPONIN I (HIGH SENSITIVITY) - Abnormal; Notable for the following components:   Troponin I (High Sensitivity) 25 (*)    All other components within normal limits  CULTURE, BLOOD (ROUTINE X 2)  LACTIC ACID, PLASMA  PROCALCITONIN  FERRITIN  TRIGLYCERIDES  FIBRINOGEN    ____________________________________________  EKG   EKG Interpretation  Date/Time:  Wednesday August 27 2019 17:46:54 EST Ventricular Rate:  99 PR Interval:    QRS Duration: 87 QT Interval:  452 QTC Calculation: 581 R Axis:   -62 Text Interpretation: Atrial fibrillation Left anterior fascicular block Probable anteroseptal infarct, old Borderline T abnormalities, inferior leads Prolonged QT interval When comapred to prior, similar a fib. No STEMI Confirmed by Antony Blackbird [phone]) on 08/28/2019 9:54:43 AM       ____________________________________________  RADIOLOGY  CXR reviewed.  ____________________________________________   PROCEDURES  Procedure(s) performed:   Procedures  None ____________________________________________   INITIAL IMPRESSION / ASSESSMENT AND PLAN / ED COURSE  Pertinent labs & imaging results that were available during my care of the patient were reviewed by me and considered in my medical decision making (see chart for details).   Patient presents to the emergency department with Covid-like symptoms over the past 6 days.  She is having  some exertional dyspnea.  Vital signs on arrival show no fever or hypoxemia at rest but patient is tachycardic.  She is overall well-appearing.  Plan for Covid screening labs, rapid test, chest x-ray, and ambulatory monitoring.   COVID positive. Labs and imaging reviewed. No acute findings. Patient ambulate in the room with no SOB or hypoxemia. Discussed with patient and daughter that she is at high risk for hospitalization and should monitor symptoms very closely and return if worsening. Offered obs admit but patient and family would like to manage at home.  ____________________________________________  FINAL CLINICAL IMPRESSION(S) / ED DIAGNOSES  Final diagnoses:  COVID-19     NEW OUTPATIENT MEDICATIONS STARTED DURING THIS VISIT:  Discharge Medication List as of 08/27/2019  9:37 PM    START taking these  medications   Details  ondansetron (ZOFRAN ODT) 4 MG disintegrating tablet Take 1 tablet (4 mg total) by mouth every 8 (eight) hours as needed for nausea., Starting Wed 08/27/2019, Print        Note:  This document was prepared using Dragon voice recognition software and may include unintentional dictation errors.  Alona Bene, MD, [facility] Emergency Medicine    Zamarion Longest, Arlyss Repress, MD 08/30/19 [phone]

## 2019-08-29 ENCOUNTER — Telehealth: Payer: Self-pay | Admitting: Nurse Practitioner

## 2019-08-29 NOTE — Telephone Encounter (Signed)
Called to Discuss with patient about Covid symptoms and the use of bamlanivimab, a monoclonal antibody infusion for those with mild to moderate Covid symptoms and at a high risk of hospitalization.     Pt is qualified for this infusion at the [facility] infusion center due to co-morbid conditions and/or a member of an at-risk group.    Patient is being managed for the following:  Patient Active Problem List   Diagnosis Date Noted  . Cerebrovascular accident (CVA) ([place])   . Other hyperlipidemia   . Essential hypertension   . CVA (cerebral vascular accident) ([place]) 11/05/2016      Unable to reach pt

## 2019-08-30 ENCOUNTER — Telehealth (HOSPITAL_BASED_OUTPATIENT_CLINIC_OR_DEPARTMENT_OTHER): Payer: Self-pay | Admitting: Emergency Medicine

## 2019-08-30 LAB — CULTURE, BLOOD (ROUTINE X 2): Special Requests: ADEQUATE

## 2019-08-31 ENCOUNTER — Telehealth: Payer: Self-pay | Admitting: Emergency Medicine

## 2019-09-01 ENCOUNTER — Telehealth: Payer: Self-pay | Admitting: *Deleted

## 2019-09-01 LAB — CULTURE, BLOOD (ROUTINE X 2)
Culture: NO GROWTH
Special Requests: ADEQUATE

## 2019-09-01 NOTE — Telephone Encounter (Signed)
Spoke with pt's daughter. Requesting guidelines for quarantine. Reviewed to her satisfaction. States her mother "Coughs a lot in the mornings, better throughout day Increased fatigue, no appetite but staying hydrated. Temp had ranged 99.0. 101.0 max. Denies any SOB. Kenney Houseman, NP had tried to reach pt regarding infusion.   Daughter requesting CB at her number:  [phone]

## 2019-09-02 ENCOUNTER — Telehealth: Payer: Self-pay | Admitting: Nurse Practitioner

## 2019-09-02 NOTE — Telephone Encounter (Signed)
Called to Discuss with patient about Covid symptoms and the use of bamlanivimab, a monoclonal antibody infusion for those with mild to moderate Covid symptoms and at a high risk of hospitalization.     Pt is qualified for this infusion at the [facility] infusion center due to co-morbid conditions and/or a member of an at-risk group.    Patient Active Problem List   Diagnosis Date Noted  . Cerebrovascular accident (CVA) ([place])   . Other hyperlipidemia   . Essential hypertension   . CVA (cerebral vascular accident) ([place]) 11/05/2016    Daughter called asking about infusion. Patient is greater than 10 days on symptoms.

## 2020-05-01 ENCOUNTER — Emergency Department (HOSPITAL_BASED_OUTPATIENT_CLINIC_OR_DEPARTMENT_OTHER)
Admission: EM | Admit: 2020-05-01 | Discharge: 2020-05-01 | Disposition: A | Discharge status: Home / Self Care | Payer: Medicare Other | Attending: Emergency Medicine | Admitting: Emergency Medicine

## 2020-05-01 ENCOUNTER — Emergency Department (HOSPITAL_BASED_OUTPATIENT_CLINIC_OR_DEPARTMENT_OTHER): Payer: Medicare Other

## 2020-05-01 ENCOUNTER — Other Ambulatory Visit: Payer: Self-pay

## 2020-05-01 ENCOUNTER — Encounter (HOSPITAL_BASED_OUTPATIENT_CLINIC_OR_DEPARTMENT_OTHER): Payer: Self-pay | Admitting: *Deleted

## 2020-05-01 DIAGNOSIS — S0990XA Unspecified injury of head, initial encounter: Secondary | ICD-10-CM | POA: Insufficient documentation

## 2020-05-01 DIAGNOSIS — I509 Heart failure, unspecified: Secondary | ICD-10-CM | POA: Diagnosis not present

## 2020-05-01 DIAGNOSIS — Y999 Unspecified external cause status: Secondary | ICD-10-CM | POA: Diagnosis not present

## 2020-05-01 DIAGNOSIS — S8002XA Contusion of left knee, initial encounter: Secondary | ICD-10-CM | POA: Diagnosis not present

## 2020-05-01 DIAGNOSIS — S022XXA Fracture of nasal bones, initial encounter for closed fracture: Secondary | ICD-10-CM

## 2020-05-01 DIAGNOSIS — I110 Hypertensive heart disease with heart failure: Secondary | ICD-10-CM | POA: Insufficient documentation

## 2020-05-01 DIAGNOSIS — W208XXA Other cause of strike by thrown, projected or falling object, initial encounter: Secondary | ICD-10-CM | POA: Diagnosis not present

## 2020-05-01 DIAGNOSIS — S0033XA Contusion of nose, initial encounter: Secondary | ICD-10-CM | POA: Insufficient documentation

## 2020-05-01 DIAGNOSIS — Y9389 Activity, other specified: Secondary | ICD-10-CM | POA: Insufficient documentation

## 2020-05-01 DIAGNOSIS — Y9289 Other specified places as the place of occurrence of the external cause: Secondary | ICD-10-CM | POA: Insufficient documentation

## 2020-05-01 DIAGNOSIS — Z87891 Personal history of nicotine dependence: Secondary | ICD-10-CM | POA: Diagnosis not present

## 2020-05-01 MED ORDER — ACETAMINOPHEN 325 MG PO TABS
650.0000 mg | ORAL_TABLET | Freq: Once | ORAL | Status: AC
  Administered 2020-05-01: 650 mg via ORAL
  Filled 2020-05-01: qty 2

## 2020-05-01 NOTE — ED Notes (Signed)
Patient transported to radiology

## 2020-05-01 NOTE — ED Notes (Signed)
Pt left before receiving d/c paperwork.

## 2020-05-01 NOTE — ED Triage Notes (Addendum)
Pt reports she fell just pta at home. She tripped on carpet. Denies LOC. Pt was able to get up on her own. CAO x 4 in triage. Pt takes xarelto. Facial bruising noted and also c/o pain to left knee. She lives with her daughter. Pt had "bubble procedure" left eye last week

## 2020-05-01 NOTE — ED Notes (Signed)
ED Provider at bedside. 

## 2020-05-01 NOTE — ED Provider Notes (Signed)
[facility] EMERGENCY DEPARTMENT Provider Note   CSN: [id number] Arrival date & time: 05/01/20  1456     History Chief Complaint  Patient presents with  . Fall    Melissa Cardenas is a 86 y.o. female hx of afib on Xarelto, CHF, hypertension, here presenting with fall.  Patient states that she was carrying her lunch tray and her left foot got caught and she landed face first and hit her nose as well as left knee.  Denies passing out or other injuries.  Patient states that this happened about 2:30 PM today.  No meds prior to arrival.  Denies any chest pain or shortness of breath.  The history is provided by the patient.       Past Medical History:  Diagnosis Date  . A-fib (HCC)   . Anemia   . Arthritis   . Blood transfusion   . CHF (congestive heart failure) (HCC)   . Heart attack (HCC)   . Hypertension   . Stroke (HCC)   . Thyroid disease     Patient Active Problem List   Diagnosis Date Noted  . Cerebrovascular accident (CVA) (HCC)   . Other hyperlipidemia   . Essential hypertension   . CVA (cerebral vascular accident) (HCC) 11/05/2016    Past Surgical History:  Procedure Laterality Date  . BREAST BIOPSY    . cabg  1990  . CORONARY ARTERY BYPASS GRAFT       OB History   No obstetric history on file.     Family History  Problem Relation Age of Onset  . Heart attack Son        2 sons passed from heart attack  . Hyperlipidemia Daughter     Social History   Tobacco Use  . Smoking status: Former Smoker    Types: Cigarettes    Start date: 1945    Quit date: 1977    Years since quitting: 44.6  . Smokeless tobacco: Never Used  . Tobacco comment: 12-14 cigarettes per day from age [age]  Vaping Use  . Vaping Use: Never used  Substance Use Topics  . Alcohol use: Not Currently    Comment: rare  . Drug use: No    Home Medications Prior to Admission medications   Medication Sig Start Date End Date Taking? Authorizing Provider  carvedilol  (COREG) 6.25 MG tablet Take 3.125 mg by mouth 2 (two) times daily with a meal.     [provider]  ciprofloxacin (CIPRO) 500 MG tablet Take 1 tablet (500 mg total) by mouth 2 (two) times daily. Patient not taking: Reported on 11/05/2016 09/24/16   Rolland Porter, MD  clopidogrel (PLAVIX) 75 MG tablet Take 1 tablet (75 mg total) by mouth daily. 11/07/16   Thomasene Lot, MD  Hyoscyamine Sulfate SL 0.125 MG SUBL Place 0.125 mg under the tongue as needed (for stomach).     [provider]  Multiple Vitamin (MULTIVITAMIN WITH MINERALS) TABS tablet Take 1 tablet by mouth daily.    [provider]  Multiple Vitamins-Minerals (PRESERVISION AREDS PO) Take 1 tablet by mouth daily.    [provider]  nitroGLYCERIN (NITROSTAT) 0.4 MG SL tablet Place 0.4 mg under the tongue every 5 (five) minutes as needed for chest pain.    [provider]  ondansetron (ZOFRAN ODT) 4 MG disintegrating tablet Take 1 tablet (4 mg total) by mouth every 8 (eight) hours as needed for nausea. 08/27/19   Long, Arlyss Repress, MD  phenazopyridine (  PYRIDIUM) 200 MG tablet Take 1 tablet (200 mg total) by mouth 3 (three) times daily. Patient not taking: Reported on 11/05/2016 09/24/16   Rolland Porter, MD  polyethylene glycol powder (GLYCOLAX/MIRALAX) powder Take 17 g by mouth daily.      [provider]  ranolazine (RANEXA) 500 MG 12 hr tablet Take 500 mg by mouth 2 (two) times daily.      [provider]  rosuvastatin (CRESTOR) 40 MG tablet Take 40 mg by mouth daily.      [provider]    Allergies    Atromid s [clofibrate], Avelox [moxifloxacin hydrochloride], Nitrofuran derivatives, Other, Penicillins, Sulfa antibiotics, and Tetracycline  Review of Systems   Review of Systems  HENT:       Pain on the nose   Musculoskeletal:       L knee pain and swelling   All other systems reviewed and are negative.   Physical Exam Updated Vital Signs BP (!) 184/94 (BP Location:  Right Arm)   Pulse 78   Temp 97.9 F (36.6 C) (Oral)   Resp 16   Ht 4\' 11"  (1.499 m)   Wt 48.1 kg   SpO2 97%   BMI 21.41 kg/m   Physical Exam Vitals and nursing note reviewed.  HENT:     Head:     Comments: + tenderness and ecchymosis on the bridge of the nose.  No obvious septal hematoma     Mouth/Throat:     Mouth: Mucous membranes are moist.  Eyes:     Extraocular Movements: Extraocular movements intact.     Pupils: Pupils are equal, round, and reactive to light.  Cardiovascular:     Rate and Rhythm: Normal rate and regular rhythm.     Pulses: Normal pulses.     Heart sounds: Normal heart sounds.  Pulmonary:     Effort: Pulmonary effort is normal.     Breath sounds: Normal breath sounds.  Abdominal:     General: Abdomen is flat.     Palpations: Abdomen is soft.  Musculoskeletal:        General: Normal range of motion.     Cervical back: Normal range of motion.     Comments: No spinal tenderness. + L knee with some ecchymosis but PCL and ACL intact, nl ROM, nl ROM of bilateral hips    Skin:    General: Skin is warm.     Capillary Refill: Capillary refill takes less than 2 seconds.  Neurological:     General: No focal deficit present.     Mental Status: She is alert and oriented to person, place, and time.  Psychiatric:        Mood and Affect: Mood normal.        Behavior: Behavior normal.     ED Results / Procedures / Treatments   Labs (all labs ordered are listed, but only abnormal results are displayed) Labs Reviewed - No data to display  EKG None  Radiology CT Head Wo Contrast  Result Date: 05/01/2020 CLINICAL DATA:  Fall, facial injury, blurred vision, bruising to nose EXAM: CT HEAD WITHOUT CONTRAST CT MAXILLOFACIAL WITHOUT CONTRAST CT CERVICAL SPINE WITHOUT CONTRAST TECHNIQUE: Multidetector CT imaging of the head, cervical spine, and maxillofacial structures were performed using the standard protocol without intravenous contrast. Multiplanar CT image  reconstructions of the cervical spine and maxillofacial structures were also generated. COMPARISON:  06/25/2011 FINDINGS: CT HEAD FINDINGS Brain: No evidence of acute infarction, hemorrhage, hydrocephalus, extra-axial collection or  mass lesion/mass effect. Periventricular and deep white matter hypodensity. Vascular: No hyperdense vessel or unexpected calcification. CT FACIAL BONES FINDINGS Skull: Normal. Negative for fracture or focal lesion. Facial bones: Mildly displaced and angulated fractures of the nasal bones (series 3, image 9). No other displaced fractures or dislocations. Sinuses/Orbits: No acute finding. Other: None. CT CERVICAL SPINE FINDINGS Alignment: Degenerative straightening of the normal cervical lordosis with degenerative anterolisthesis of C2 on C3. Skull base and vertebrae: No acute fracture. No primary bone lesion or focal pathologic process. Soft tissues and spinal canal: No prevertebral fluid or swelling. No visible canal hematoma. Disc levels: Severe multilevel disc degenerative disease and osteophytosis of the cervical spine. Upper chest: Negative. Other: Multinodular thyroid goiter, incompletely imaged. IMPRESSION: 1. No acute intracranial pathology. Small-vessel white matter disease. 2. Mildly displaced and angulated fractures of the nasal bones. No other displaced fractures or dislocations of the facial bones. 3. No fracture or static subluxation of the cervical spine. 4. Severe multilevel disc degenerative disease and osteophytosis of the cervical spine. 5. Multinodular thyroid goiter, incompletely imaged. In the setting of significant comorbidities or limited life expectancy, no follow-up recommended (ref: J Am Coll Radiol. 2015 Feb;12(2): 143-50). Electronically Signed   By: Lauralyn Primes M.D.   On: 05/01/2020 16:51   CT Cervical Spine Wo Contrast  Result Date: 05/01/2020 CLINICAL DATA:  Fall, facial injury, blurred vision, bruising to nose EXAM: CT HEAD WITHOUT CONTRAST CT  MAXILLOFACIAL WITHOUT CONTRAST CT CERVICAL SPINE WITHOUT CONTRAST TECHNIQUE: Multidetector CT imaging of the head, cervical spine, and maxillofacial structures were performed using the standard protocol without intravenous contrast. Multiplanar CT image reconstructions of the cervical spine and maxillofacial structures were also generated. COMPARISON:  06/25/2011 FINDINGS: CT HEAD FINDINGS Brain: No evidence of acute infarction, hemorrhage, hydrocephalus, extra-axial collection or mass lesion/mass effect. Periventricular and deep white matter hypodensity. Vascular: No hyperdense vessel or unexpected calcification. CT FACIAL BONES FINDINGS Skull: Normal. Negative for fracture or focal lesion. Facial bones: Mildly displaced and angulated fractures of the nasal bones (series 3, image 9). No other displaced fractures or dislocations. Sinuses/Orbits: No acute finding. Other: None. CT CERVICAL SPINE FINDINGS Alignment: Degenerative straightening of the normal cervical lordosis with degenerative anterolisthesis of C2 on C3. Skull base and vertebrae: No acute fracture. No primary bone lesion or focal pathologic process. Soft tissues and spinal canal: No prevertebral fluid or swelling. No visible canal hematoma. Disc levels: Severe multilevel disc degenerative disease and osteophytosis of the cervical spine. Upper chest: Negative. Other: Multinodular thyroid goiter, incompletely imaged. IMPRESSION: 1. No acute intracranial pathology. Small-vessel white matter disease. 2. Mildly displaced and angulated fractures of the nasal bones. No other displaced fractures or dislocations of the facial bones. 3. No fracture or static subluxation of the cervical spine. 4. Severe multilevel disc degenerative disease and osteophytosis of the cervical spine. 5. Multinodular thyroid goiter, incompletely imaged. In the setting of significant comorbidities or limited life expectancy, no follow-up recommended (ref: J Am Coll Radiol. 2015  Feb;12(2): 143-50). Electronically Signed   By: Lauralyn Primes M.D.   On: 05/01/2020 16:51   DG Knee Complete 4 Views Left  Result Date: 05/01/2020 CLINICAL DATA:  Fall, left knee pain EXAM: LEFT KNEE - COMPLETE 4+ VIEW COMPARISON:  None. FINDINGS: Anterior soft tissue swelling. Joint space narrowing in the patellofemoral compartment. No joint effusion. No acute bony abnormality. Specifically, no fracture, subluxation, or dislocation. IMPRESSION: Anterior soft tissue swelling.  No acute bony abnormality. Electronically Signed   By: Charlett Nose M.D.  On: 05/01/2020 16:46   CT Maxillofacial Wo Contrast  Result Date: 05/01/2020 CLINICAL DATA:  Fall, facial injury, blurred vision, bruising to nose EXAM: CT HEAD WITHOUT CONTRAST CT MAXILLOFACIAL WITHOUT CONTRAST CT CERVICAL SPINE WITHOUT CONTRAST TECHNIQUE: Multidetector CT imaging of the head, cervical spine, and maxillofacial structures were performed using the standard protocol without intravenous contrast. Multiplanar CT image reconstructions of the cervical spine and maxillofacial structures were also generated. COMPARISON:  06/25/2011 FINDINGS: CT HEAD FINDINGS Brain: No evidence of acute infarction, hemorrhage, hydrocephalus, extra-axial collection or mass lesion/mass effect. Periventricular and deep white matter hypodensity. Vascular: No hyperdense vessel or unexpected calcification. CT FACIAL BONES FINDINGS Skull: Normal. Negative for fracture or focal lesion. Facial bones: Mildly displaced and angulated fractures of the nasal bones (series 3, image 9). No other displaced fractures or dislocations. Sinuses/Orbits: No acute finding. Other: None. CT CERVICAL SPINE FINDINGS Alignment: Degenerative straightening of the normal cervical lordosis with degenerative anterolisthesis of C2 on C3. Skull base and vertebrae: No acute fracture. No primary bone lesion or focal pathologic process. Soft tissues and spinal canal: No prevertebral fluid or swelling. No visible  canal hematoma. Disc levels: Severe multilevel disc degenerative disease and osteophytosis of the cervical spine. Upper chest: Negative. Other: Multinodular thyroid goiter, incompletely imaged. IMPRESSION: 1. No acute intracranial pathology. Small-vessel white matter disease. 2. Mildly displaced and angulated fractures of the nasal bones. No other displaced fractures or dislocations of the facial bones. 3. No fracture or static subluxation of the cervical spine. 4. Severe multilevel disc degenerative disease and osteophytosis of the cervical spine. 5. Multinodular thyroid goiter, incompletely imaged. In the setting of significant comorbidities or limited life expectancy, no follow-up recommended (ref: J Am Coll Radiol. 2015 Feb;12(2): 143-50). Electronically Signed   By: Lauralyn PrimesAlex  Bibbey M.D.   On: 05/01/2020 16:51    Procedures Procedures (including critical care time)  Medications Ordered in ED Medications  acetaminophen (TYLENOL) tablet 650 mg (650 mg Oral Given 05/01/20 1607)    ED Course  I have reviewed the triage vital signs and the nursing notes.  Pertinent labs & imaging results that were available during my care of the patient were reviewed by me and considered in my medical decision making (see chart for details).    MDM Rules/Calculators/A&P                          Eugenia McalpineHalina Deming is a 86 y.o. female here with fall. Has obvious ecchymosis of the face and also L knee bruising. Consider nasal fracture vs contusion. Will get CT head/neck/face. Will get xray L knee as well.   5:04 PM Patient has nasal bone fracture.  CT head and cervical spine unremarkable.  Left knee x-ray showed no fracture.  Given knee sleeve.  I told her to apply ice and take Tylenol.  Will refer to ENT outpatient.  Final Clinical Impression(s) / ED Diagnoses Final diagnoses:  None    Rx / DC Orders ED Discharge Orders    None       Charlynne PanderYao, Danuta Huseman Hsienta, MD 05/01/20 1705

## 2020-05-01 NOTE — Discharge Instructions (Addendum)
You have a broken nose.  Please apply ice for swelling.  Take Tylenol for pain.  Use knee sleeve for comfort and please use your walker  See ENT doctor for follow-up.  Return to ER if you have trouble breathing, severe pain, headaches, vomiting.

## 2021-01-06 ENCOUNTER — Other Ambulatory Visit: Payer: Self-pay

## 2021-01-06 ENCOUNTER — Emergency Department (HOSPITAL_BASED_OUTPATIENT_CLINIC_OR_DEPARTMENT_OTHER): Payer: Medicare Other

## 2021-01-06 ENCOUNTER — Emergency Department (HOSPITAL_BASED_OUTPATIENT_CLINIC_OR_DEPARTMENT_OTHER)
Admission: EM | Admit: 2021-01-06 | Discharge: 2021-01-06 | Disposition: A | Discharge status: Home / Self Care | Payer: Medicare Other | Attending: Emergency Medicine | Admitting: Emergency Medicine

## 2021-01-06 DIAGNOSIS — I110 Hypertensive heart disease with heart failure: Secondary | ICD-10-CM | POA: Diagnosis not present

## 2021-01-06 DIAGNOSIS — Z20822 Contact with and (suspected) exposure to covid-19: Secondary | ICD-10-CM | POA: Diagnosis not present

## 2021-01-06 DIAGNOSIS — Z951 Presence of aortocoronary bypass graft: Secondary | ICD-10-CM | POA: Insufficient documentation

## 2021-01-06 DIAGNOSIS — R062 Wheezing: Secondary | ICD-10-CM | POA: Diagnosis not present

## 2021-01-06 DIAGNOSIS — Z79899 Other long term (current) drug therapy: Secondary | ICD-10-CM | POA: Diagnosis not present

## 2021-01-06 DIAGNOSIS — Z7902 Long term (current) use of antithrombotics/antiplatelets: Secondary | ICD-10-CM | POA: Diagnosis not present

## 2021-01-06 DIAGNOSIS — I5033 Acute on chronic diastolic (congestive) heart failure: Secondary | ICD-10-CM | POA: Diagnosis not present

## 2021-01-06 DIAGNOSIS — Z87891 Personal history of nicotine dependence: Secondary | ICD-10-CM | POA: Diagnosis not present

## 2021-01-06 DIAGNOSIS — R059 Cough, unspecified: Secondary | ICD-10-CM | POA: Diagnosis present

## 2021-01-06 LAB — CBC WITH DIFFERENTIAL/PLATELET
Abs Immature Granulocytes: 0.02 10*3/uL (ref 0.00–0.07)
Basophils Absolute: 0 10*3/uL (ref 0.0–0.1)
Basophils Relative: 1 %
Eosinophils Absolute: 0.2 10*3/uL (ref 0.0–0.5)
Eosinophils Relative: 3 %
HCT: 35.4 % — ABNORMAL LOW (ref 36.0–46.0)
Hemoglobin: 11.7 g/dL — ABNORMAL LOW (ref 12.0–15.0)
Immature Granulocytes: 0 %
Lymphocytes Relative: 17 %
Lymphs Abs: 0.9 10*3/uL (ref 0.7–4.0)
MCH: 29.7 pg (ref 26.0–34.0)
MCHC: 33.1 g/dL (ref 30.0–36.0)
MCV: 89.8 fL (ref 80.0–100.0)
Monocytes Absolute: 0.6 10*3/uL (ref 0.1–1.0)
Monocytes Relative: 11 %
Neutro Abs: 3.7 10*3/uL (ref 1.7–7.7)
Neutrophils Relative %: 68 %
Platelets: 202 10*3/uL (ref 150–400)
RBC: 3.94 MIL/uL (ref 3.87–5.11)
RDW: 15.8 % — ABNORMAL HIGH (ref 11.5–15.5)
WBC: 5.3 10*3/uL (ref 4.0–10.5)
nRBC: 0 % (ref 0.0–0.2)

## 2021-01-06 LAB — BRAIN NATRIURETIC PEPTIDE: B Natriuretic Peptide: 343.5 pg/mL — ABNORMAL HIGH (ref 0.0–100.0)

## 2021-01-06 LAB — COMPREHENSIVE METABOLIC PANEL
ALT: 23 U/L (ref 0–44)
AST: 33 U/L (ref 15–41)
Albumin: 3.4 g/dL — ABNORMAL LOW (ref 3.5–5.0)
Alkaline Phosphatase: 83 U/L (ref 38–126)
Anion gap: 6 (ref 5–15)
BUN: 17 mg/dL (ref 8–23)
CO2: 27 mmol/L (ref 22–32)
Calcium: 9 mg/dL (ref 8.9–10.3)
Chloride: 95 mmol/L — ABNORMAL LOW (ref 98–111)
Creatinine, Ser: 0.61 mg/dL (ref 0.44–1.00)
GFR, Estimated: >60 mL/min (ref >60)
Glucose, Bld: 128 mg/dL — ABNORMAL HIGH (ref 70–99)
Potassium: 4 mmol/L (ref 3.5–5.1)
Sodium: 128 mmol/L — ABNORMAL LOW (ref 135–145)
Total Bilirubin: 0.5 mg/dL (ref 0.3–1.2)
Total Protein: 6.7 g/dL (ref 6.5–8.1)

## 2021-01-06 LAB — RESP PANEL BY RT-PCR (FLU A&B, COVID) ARPGX2
Influenza A by PCR: NEGATIVE
Influenza B by PCR: NEGATIVE
SARS Coronavirus 2 by RT PCR: NEGATIVE

## 2021-01-06 MED ORDER — FUROSEMIDE 10 MG/ML IJ SOLN
20.0000 mg | Freq: Once | INTRAMUSCULAR | Status: AC
  Administered 2021-01-06: 20 mg via INTRAVENOUS
  Filled 2021-01-06: qty 2

## 2021-01-06 NOTE — Discharge Instructions (Addendum)
It was a pleasure taking care of you today!  It appears your increased cough is likely secondary to extra fluid related to your congestive heart failure. Increase your home lasix to 2 tablets (40mg ) daily for 2 days starting tomorrow.  You may also have other secondary cause of your chronic cough and recommend continued follow up with Pulmonology.  I do not see signs of pneumonia or sepsis and you may discontinue your antibiotic.

## 2021-01-06 NOTE — ED Notes (Signed)
ED Provider at bedside. 

## 2021-01-06 NOTE — ED Provider Notes (Signed)
[facility] EMERGENCY DEPARTMENT Provider Note   CSN: [id number] Arrival date & time: 01/06/21  1203     History Chief Complaint  Patient presents with  . Cough    Melissa Cardenas is a 87 y.o. female.  HPI       87 year old female with a history of hypertension, CVA, thyroid disease, CHF, atrial fibrillation, chronic cough for which she was scheduled to see pulmonology and have PFTs completed which she was unable to complete due to significant cough in September 2021, presents with worsening cough.  Reports that she has had a cough since her COVID diagnosis more than a year ago, however initially had worsening around March 18, saw her ENT and was started on clindamycin however once unable to take it effectively due to GI side effects--reports that her cough and congestion did improve at that time initially, however she has had worsening of her cough now between 10 days and 3 weeks ago.  Reports this morning her cough got to a point where she was coughing so hard she felt very fatigued, and she was not able to see her regular doctor and came to the emergency department for further care.  She reviewed reports she has chronic orthopnea which is unchanged, she typically sit sleep sitting up.  Reports she has chronic lower extremity edema which is also unchanged.  Denies new leg pain or swelling.  Reports that in March she had had some chest pain with swallowing, but that has since resolved and denies any chest pain today.  Denies lightheadedness, nausea, vomiting, diarrhea or constipation.  Reports she does have some chronic GI upset which is unchanged.  She denies any fevers.  Denies shortness of breath. 2lb weight gain over last 2 days.    Past Medical History:  Diagnosis Date  . A-fib (HCC)   . Anemia   . Arthritis   . Blood transfusion   . CHF (congestive heart failure) (HCC)   . Heart attack (HCC)   . Hypertension   . Stroke (HCC)   . Thyroid disease     Patient  Active Problem List   Diagnosis Date Noted  . Cerebrovascular accident (CVA) (HCC)   . Other hyperlipidemia   . Essential hypertension   . CVA (cerebral vascular accident) (HCC) 11/05/2016    Past Surgical History:  Procedure Laterality Date  . BREAST BIOPSY    . cabg  1990  . CORONARY ARTERY BYPASS GRAFT       OB History   No obstetric history on file.     Family History  Problem Relation Age of Onset  . Heart attack Son        2 sons passed from heart attack  . Hyperlipidemia Daughter     Social History   Tobacco Use  . Smoking status: Former Smoker    Types: Cigarettes    Start date: 1945    Quit date: 1977    Years since quitting: 45.3  . Smokeless tobacco: Never Used  . Tobacco comment: 12-14 cigarettes per day from age [age]  Vaping Use  . Vaping Use: Never used  Substance Use Topics  . Alcohol use: Not Currently    Comment: rare  . Drug use: No    Home Medications Prior to Admission medications   Medication Sig Start Date End Date Taking? Authorizing Provider  carvedilol (COREG) 6.25 MG tablet Take 3.125 mg by mouth 2 (two) times daily with a meal.     [provider]  ciprofloxacin (CIPRO) 500 MG tablet Take 1 tablet (500 mg total) by mouth 2 (two) times daily. Patient not taking: Reported on 11/05/2016 09/24/16   Rolland PorterJames, Mark, MD  clopidogrel (PLAVIX) 75 MG tablet Take 1 tablet (75 mg total) by mouth daily. 11/07/16   Thomasene Lotaylor, James, MD  Hyoscyamine Sulfate SL 0.125 MG SUBL Place 0.125 mg under the tongue as needed (for stomach).     [provider]  Multiple Vitamin (MULTIVITAMIN WITH MINERALS) TABS tablet Take 1 tablet by mouth daily.    [provider]  Multiple Vitamins-Minerals (PRESERVISION AREDS PO) Take 1 tablet by mouth daily.    [provider]  nitroGLYCERIN (NITROSTAT) 0.4 MG SL tablet Place 0.4 mg under the tongue every 5 (five) minutes as needed for chest pain.    [provider]  ondansetron  (ZOFRAN ODT) 4 MG disintegrating tablet Take 1 tablet (4 mg total) by mouth every 8 (eight) hours as needed for nausea. 08/27/19   Long, Arlyss RepressJoshua G, MD  phenazopyridine (PYRIDIUM) 200 MG tablet Take 1 tablet (200 mg total) by mouth 3 (three) times daily. Patient not taking: Reported on 11/05/2016 09/24/16   Rolland PorterJames, Mark, MD  polyethylene glycol powder (GLYCOLAX/MIRALAX) powder Take 17 g by mouth daily.      [provider]  ranolazine (RANEXA) 500 MG 12 hr tablet Take 500 mg by mouth 2 (two) times daily.      [provider]  rosuvastatin (CRESTOR) 40 MG tablet Take 40 mg by mouth daily.      [provider]    Allergies    Atromid s [clofibrate], Avelox [moxifloxacin hydrochloride], Nitrofuran derivatives, Other, Penicillins, Sulfa antibiotics, Apixaban, and Tetracycline  Review of Systems   Review of Systems  Constitutional: Positive for fatigue. Negative for fever.  HENT: Positive for congestion. Negative for sore throat.   Eyes: Negative for visual disturbance.  Respiratory: Positive for cough. Negative for shortness of breath.   Cardiovascular: Negative for chest pain.  Gastrointestinal: Negative for abdominal pain, nausea and vomiting.  Genitourinary: Negative for difficulty urinating.  Musculoskeletal: Negative for back pain and neck pain.  Skin: Negative for rash.  Neurological: Negative for syncope and headaches.    Physical Exam Updated Vital Signs BP (!) 183/99 (BP Location: Right Arm)   Pulse 77   Temp 98 F (36.7 C) (Oral)   Resp 18   Ht 5' (1.524 m)   Wt 47.6 kg   SpO2 95%   BMI 20.51 kg/m   Physical Exam Vitals and nursing note reviewed.  Constitutional:      General: She is not in acute distress.    Appearance: She is well-developed. She is not diaphoretic.  HENT:     Head: Normocephalic and atraumatic.  Eyes:     Conjunctiva/sclera: Conjunctivae normal.  Cardiovascular:     Rate and Rhythm: Normal rate and regular rhythm.      Heart sounds: Normal heart sounds. No murmur heard. No friction rub. No gallop.   Pulmonary:     Effort: Pulmonary effort is normal. No respiratory distress.     Breath sounds: Wheezing present. No rales.     Comments: Frequent cough  Abdominal:     General: There is no distension.     Palpations: Abdomen is soft.     Tenderness: There is no abdominal tenderness. There is no guarding.  Musculoskeletal:        General: No tenderness.     Cervical back: Normal range of motion.  Right lower leg: Edema present.     Left lower leg: Edema present.  Skin:    General: Skin is warm and dry.     Findings: No erythema or rash.  Neurological:     Mental Status: She is alert and oriented to person, place, and time.     ED Results / Procedures / Treatments   Labs (all labs ordered are listed, but only abnormal results are displayed) Labs Reviewed  CBC WITH DIFFERENTIAL/PLATELET - Abnormal; Notable for the following components:      Result Value   Hemoglobin 11.7 (*)    HCT 35.4 (*)    RDW 15.8 (*)    All other components within normal limits  COMPREHENSIVE METABOLIC PANEL - Abnormal; Notable for the following components:   Sodium 128 (*)    Chloride 95 (*)    Glucose, Bld 128 (*)    Albumin 3.4 (*)    All other components within normal limits  BRAIN NATRIURETIC PEPTIDE - Abnormal; Notable for the following components:   B Natriuretic Peptide 343.5 (*)    All other components within normal limits  RESP PANEL BY RT-PCR (FLU A&B, COVID) ARPGX2    EKG None  Radiology DG Chest Portable 1 View  Result Date: 01/06/2021 CLINICAL DATA:  Shortness of breath. Cough for more than 2 weeks. EXAM: PORTABLE CHEST 1 VIEW COMPARISON:  October 14, 2020 FINDINGS: Stable postsurgical changes from CABG. Enlarged cardiac silhouette. There is no evidence of focal airspace consolidation, pleural effusion or pneumothorax. Mild interstitial prominence. Osseous structures are without acute abnormality.  Osteopenia and chronic osteoarthritic changes of the right shoulder. Soft tissues are grossly normal. IMPRESSION: 1. Enlarged cardiac silhouette. 2. Mild interstitial prominence may represent mild interstitial pulmonary edema. Electronically Signed   By: Ted Mcalpine M.D.   On: 01/06/2021 14:25    Procedures Procedures   Medications Ordered in ED Medications  furosemide (LASIX) injection 20 mg (20 mg Intravenous Given 01/06/21 1531)    ED Course  I have reviewed the triage vital signs and the nursing notes.  Pertinent labs & imaging results that were available during my care of the patient were reviewed by me and considered in my medical decision making (see chart for details).    MDM Rules/Calculators/A&P                           87 year old female with a history of hypertension, CVA, thyroid disease, CHF, atrial fibrillation, chronic cough for which she was scheduled to see pulmonology and have PFTs completed which she was unable to complete due to significant cough in September 2021, presents with worsening cough.  EKG shows atrial fibrillation with TW changes inferior/lateral similar to prior.  CXR shows enlarged cardiac silhouette, mild interstitial prominence and mild interstitial pulmonary edema. BNP 343. Sodium 128, similar to 2020 when it was 129. No hx of lung disease. No leukocytosis, do not feel history/XR is consistent with pneumonia.   Suspect increased cough secondary to fluid overload. Given IV lasix and recommend increased dose for 2 days and outpatient follow up.  Oxygen saturations normal. Recommend follow up with pulmonology given chronic cough for further evaluation. Patient discharged in stable condition with understanding of reasons to return.   Final Clinical Impression(s) / ED Diagnoses Final diagnoses:  Acute on chronic diastolic congestive heart failure (HCC)    Rx / DC Orders ED Discharge Orders    None  Alvira Monday, MD 01/06/21  [phone]

## 2021-01-06 NOTE — ED Triage Notes (Signed)
Cough greater than 2 weeks - moist & white   Currently on ABX  SOB when coughing and ambulation

## 2021-01-17 ENCOUNTER — Other Ambulatory Visit: Payer: Self-pay

## 2021-01-17 ENCOUNTER — Emergency Department (HOSPITAL_BASED_OUTPATIENT_CLINIC_OR_DEPARTMENT_OTHER): Payer: Medicare Other

## 2021-01-17 ENCOUNTER — Emergency Department (HOSPITAL_BASED_OUTPATIENT_CLINIC_OR_DEPARTMENT_OTHER)
Admission: EM | Admit: 2021-01-17 | Discharge: 2021-01-17 | Disposition: A | Discharge status: Home / Self Care | Payer: Medicare Other | Attending: Emergency Medicine | Admitting: Emergency Medicine

## 2021-01-17 ENCOUNTER — Encounter (HOSPITAL_BASED_OUTPATIENT_CLINIC_OR_DEPARTMENT_OTHER): Payer: Self-pay | Admitting: *Deleted

## 2021-01-17 DIAGNOSIS — Z951 Presence of aortocoronary bypass graft: Secondary | ICD-10-CM | POA: Insufficient documentation

## 2021-01-17 DIAGNOSIS — J029 Acute pharyngitis, unspecified: Secondary | ICD-10-CM | POA: Diagnosis present

## 2021-01-17 DIAGNOSIS — Z87891 Personal history of nicotine dependence: Secondary | ICD-10-CM | POA: Diagnosis not present

## 2021-01-17 DIAGNOSIS — Z79899 Other long term (current) drug therapy: Secondary | ICD-10-CM | POA: Diagnosis not present

## 2021-01-17 DIAGNOSIS — I110 Hypertensive heart disease with heart failure: Secondary | ICD-10-CM | POA: Diagnosis not present

## 2021-01-17 DIAGNOSIS — Z7902 Long term (current) use of antithrombotics/antiplatelets: Secondary | ICD-10-CM | POA: Diagnosis not present

## 2021-01-17 DIAGNOSIS — I509 Heart failure, unspecified: Secondary | ICD-10-CM | POA: Insufficient documentation

## 2021-01-17 DIAGNOSIS — U071 COVID-19: Secondary | ICD-10-CM | POA: Diagnosis not present

## 2021-01-17 LAB — RESP PANEL BY RT-PCR (FLU A&B, COVID) ARPGX2
Influenza A by PCR: NEGATIVE
Influenza B by PCR: NEGATIVE
SARS Coronavirus 2 by RT PCR: POSITIVE — AB

## 2021-01-17 LAB — GROUP A STREP BY PCR: Group A Strep by PCR: NOT DETECTED

## 2021-01-17 MED ORDER — AZITHROMYCIN 250 MG PO TABS: 250.0000 mg | ORAL_TABLET | Freq: Every day | ORAL | 0 refills | Status: DC

## 2021-01-17 NOTE — ED Notes (Signed)
For the past 4 weeks has felt well, tired, appetite poor, has had a sore throat for the past 3 days. Pt c/o sore throat, and aching all over.

## 2021-01-17 NOTE — ED Provider Notes (Addendum)
[facility] EMERGENCY DEPARTMENT Provider Note   CSN: [id number] Arrival date & time: 01/17/21  1334     History Chief Complaint  Patient presents with  . Sore Throat    Melissa Cardenas is a 87 y.o. female.  HPI   87 year old female with past medical history of HTN, atrial fibrillation, CHF, chronic cough presents the emergency department concern for sore throat.  Patient states a couple weeks ago she was seen by her primary doctor, diagnosed with a "throat infection" and sinusitis, placed on clindamycin.  She states that she got better while taking the clindamycin however this caused severe diarrhea so she did stop the regimen early.  She feels like this infection never fully resolved, she is now experiencing ongoing postnasal drip/sore throat and congestion.  As an aside she has also been dealing with a chronic nightly/morning cough that she sees pulmonology for tomorrow.  Today her main concern is ongoing congestion, postnasal drip and sore throat.  She states is uncomfortable when she swallows, denies any difficulty swallowing, no change in voice.  No neck swelling, no drooling.  No tongue/oral swelling.  Patient right now denies any chest pain, shortness of breath, swelling of her lower extremities.  Past Medical History:  Diagnosis Date  . A-fib (HCC)   . Anemia   . Arthritis   . Blood transfusion   . CHF (congestive heart failure) (HCC)   . Heart attack (HCC)   . Hypertension   . Stroke (HCC)   . Thyroid disease     Patient Active Problem List   Diagnosis Date Noted  . Cerebrovascular accident (CVA) (HCC)   . Other hyperlipidemia   . Essential hypertension   . CVA (cerebral vascular accident) (HCC) 11/05/2016    Past Surgical History:  Procedure Laterality Date  . BREAST BIOPSY    . cabg  1990  . CORONARY ARTERY BYPASS GRAFT       OB History   No obstetric history on file.     Family History  Problem Relation Age of Onset  . Heart attack Son         2 sons passed from heart attack  . Hyperlipidemia Daughter     Social History   Tobacco Use  . Smoking status: Former Smoker    Types: Cigarettes    Start date: 1945    Quit date: 1977    Years since quitting: 45.3  . Smokeless tobacco: Never Used  . Tobacco comment: 12-14 cigarettes per day from age [age]  Vaping Use  . Vaping Use: Never used  Substance Use Topics  . Alcohol use: Not Currently    Comment: rare  . Drug use: No    Home Medications Prior to Admission medications   Medication Sig Start Date End Date Taking? Authorizing Provider  carvedilol (COREG) 6.25 MG tablet Take 3.125 mg by mouth 2 (two) times daily with a meal.     [provider]  ciprofloxacin (CIPRO) 500 MG tablet Take 1 tablet (500 mg total) by mouth 2 (two) times daily. Patient not taking: Reported on 11/05/2016 09/24/16   Rolland Porter, MD  clopidogrel (PLAVIX) 75 MG tablet Take 1 tablet (75 mg total) by mouth daily. 11/07/16   Thomasene Lot, MD  Hyoscyamine Sulfate SL 0.125 MG SUBL Place 0.125 mg under the tongue as needed (for stomach).     [provider]  Multiple Vitamin (MULTIVITAMIN WITH MINERALS) TABS tablet Take 1 tablet by mouth daily.    [provider]  Multiple Vitamins-Minerals (PRESERVISION AREDS PO) Take 1 tablet by mouth daily.    [provider]  nitroGLYCERIN (NITROSTAT) 0.4 MG SL tablet Place 0.4 mg under the tongue every 5 (five) minutes as needed for chest pain.    [provider]  ondansetron (ZOFRAN ODT) 4 MG disintegrating tablet Take 1 tablet (4 mg total) by mouth every 8 (eight) hours as needed for nausea. 08/27/19   Long, Arlyss Repress, MD  phenazopyridine (PYRIDIUM) 200 MG tablet Take 1 tablet (200 mg total) by mouth 3 (three) times daily. Patient not taking: Reported on 11/05/2016 09/24/16   Rolland Porter, MD  polyethylene glycol powder (GLYCOLAX/MIRALAX) powder Take 17 g by mouth daily.      [provider]  ranolazine (RANEXA)  500 MG 12 hr tablet Take 500 mg by mouth 2 (two) times daily.      [provider]  rosuvastatin (CRESTOR) 40 MG tablet Take 40 mg by mouth daily.      [provider]    Allergies    Atromid s [clofibrate], Avelox [moxifloxacin hydrochloride], Nitrofuran derivatives, Other, Penicillins, Sulfa antibiotics, Apixaban, and Tetracycline  Review of Systems   Review of Systems  Constitutional: Positive for fatigue. Negative for chills and fever.  HENT: Positive for congestion, sinus pressure and sore throat. Negative for ear pain, trouble swallowing and voice change.   Eyes: Negative for visual disturbance.  Respiratory: Negative for chest tightness and shortness of breath.   Cardiovascular: Negative for chest pain, palpitations and leg swelling.  Gastrointestinal: Negative for abdominal pain, diarrhea and vomiting.  Genitourinary: Negative for dysuria.  Skin: Negative for rash.  Neurological: Negative for headaches.    Physical Exam Updated Vital Signs BP (!) 164/89   Pulse 85   Temp 98.7 F (37.1 C) (Oral)   Resp 16   Ht 4\' 11"  (1.499 m)   Wt 45.8 kg   SpO2 98%   BMI 20.40 kg/m   Physical Exam Vitals and nursing note reviewed.  Constitutional:      Appearance: Normal appearance.  HENT:     Head: Normocephalic.     Mouth/Throat:     Mouth: Mucous membranes are moist. Oral lesions present.     Pharynx: Uvula midline. Posterior oropharyngeal erythema present. No oropharyngeal exudate or uvula swelling.     Tonsils: No tonsillar exudate or tonsillar abscesses.  Cardiovascular:     Rate and Rhythm: Normal rate.  Pulmonary:     Effort: Pulmonary effort is normal. No respiratory distress.  Abdominal:     Palpations: Abdomen is soft.     Tenderness: There is no abdominal tenderness.  Skin:    General: Skin is warm.  Neurological:     Mental Status: She is alert and oriented to person, place, and time. Mental status is at baseline.  Psychiatric:        Mood  and Affect: Mood normal.     ED Results / Procedures / Treatments   Labs (all labs ordered are listed, but only abnormal results are displayed) Labs Reviewed  GROUP A STREP BY PCR  RESP PANEL BY RT-PCR (FLU A&B, COVID) ARPGX2    EKG None  Radiology DG Chest 2 View  Result Date: 01/17/2021 CLINICAL DATA:  Productive cough. EXAM: CHEST - 2 VIEW COMPARISON:  January 06, 2021. FINDINGS: Similar enlarged cardiac silhouette. Postsurgical changes of CABG with median sternotomy. No consolidation. Similar linear opacity at the left lung base, likely scar. Mild chronic interstitial prominence. No visible  pneumothorax. Small right pleural effusion versus pleural thickening. IMPRESSION: 1. Small right pleural effusion versus pleural thickening. Otherwise, no acute cardiopulmonary disease. 2. Cardiomegaly. Electronically Signed   By: Feliberto Harts MD   On: 01/17/2021 14:21    Procedures Procedures   Medications Ordered in ED Medications - No data to display  ED Course  I have reviewed the triage vital signs and the nursing notes.  Pertinent labs & imaging results that were available during my care of the patient were reviewed by me and considered in my medical decision making (see chart for details).    MDM Rules/Calculators/A&P                          87 year old female presents the emergency department ongoing postnasal drip, congestion and sore throat.  She was originally diagnosed with pharyngitis and sinusitis that was treated with clindamycin, however she did not tolerate the full regimen and she feels like her symptoms have returned and did not fully resolve.  Her vitals are stable on arrival.  She has nasal congestion on physical exam.  There is postnasal drip noted with mild pharyngitis without exudate/unilateral swelling.  She is swallowing without difficulty.  No significant lymphadenopathy.  Lung sounds are clear, chest x-ray does show a small right pleural effusion versus  pleural thickening.  She sees pulmonology tomorrow.  Strep swab was negative.  COVID swab is positive.    Patient's symptoms have been going on for greater than 3 days, no indication for Paxlovid.  Discussed in length with the patient and her daughter symptomatic treatment.  Her vitals are normal, she is ambulating in the room without any respiratory distress.  Patient will be discharged and treated as an outpatient.  Discharge plan and strict return to ED precautions discussed, patient verbalizes understanding and agreement.  Final Clinical Impression(s) / ED Diagnoses Final diagnoses:  None    Rx / DC Orders ED Discharge Orders    None       Rozelle Logan, DO 01/17/21 1650    Staria Birkhead, Clabe Seal, DO 01/17/21 1707

## 2021-01-17 NOTE — Discharge Instructions (Addendum)
You have been seen and discharged from the emergency department.  Your strep test was negative, your COVID test is POSITIVE.  Continue to take over-the-counter medication for symptom control.  Stay well-hydrated.  Follow-up with your primary provider and pulmonologist tomorrow for reevaluation and further care. Take home medications as prescribed. If you have any worsening symptoms, uncontrolled high fevers, chest pain, difficulty breathing or further concerns for your health please return to an emergency department for further evaluation.

## 2021-01-17 NOTE — ED Triage Notes (Addendum)
C/o  Pro cough and  Increased congestion weakness  , sore throat x 2 weeks, seen by ED and PMD for same completed ABX , appt with Pulmonary tomorrow.

## 2021-01-18 ENCOUNTER — Telehealth (HOSPITAL_COMMUNITY): Payer: Self-pay | Admitting: Adult Health

## 2021-01-18 NOTE — Telephone Encounter (Signed)
Called to discuss with patient about COVID-19 symptoms and the use of one of the available treatments for those with mild to moderate Covid symptoms and at a high risk of hospitalization.  Pt appears to qualify for outpatient treatment due to co-morbid conditions and/or a member of an at-risk group in accordance with the FDA Emergency Use Authorization.    Symptom onset: unclear Vaccinated: yes Booster? no Immunocompromised? no Qualifiers: 5 NIH Criteria: 4  Unable to reach pt - LMOM   Noreene Filbert

## 2021-04-11 ENCOUNTER — Emergency Department (HOSPITAL_BASED_OUTPATIENT_CLINIC_OR_DEPARTMENT_OTHER): Payer: Medicare Other

## 2021-04-11 ENCOUNTER — Emergency Department (HOSPITAL_BASED_OUTPATIENT_CLINIC_OR_DEPARTMENT_OTHER)
Admission: EM | Admit: 2021-04-11 | Discharge: 2021-04-11 | Disposition: A | Discharge status: Home / Self Care | Payer: Medicare Other | Attending: Emergency Medicine | Admitting: Emergency Medicine

## 2021-04-11 ENCOUNTER — Encounter (HOSPITAL_BASED_OUTPATIENT_CLINIC_OR_DEPARTMENT_OTHER): Payer: Self-pay

## 2021-04-11 ENCOUNTER — Other Ambulatory Visit: Payer: Self-pay

## 2021-04-11 DIAGNOSIS — Z79899 Other long term (current) drug therapy: Secondary | ICD-10-CM | POA: Diagnosis not present

## 2021-04-11 DIAGNOSIS — S299XXA Unspecified injury of thorax, initial encounter: Secondary | ICD-10-CM | POA: Diagnosis present

## 2021-04-11 DIAGNOSIS — R109 Unspecified abdominal pain: Secondary | ICD-10-CM | POA: Diagnosis not present

## 2021-04-11 DIAGNOSIS — W01198A Fall on same level from slipping, tripping and stumbling with subsequent striking against other object, initial encounter: Secondary | ICD-10-CM | POA: Diagnosis not present

## 2021-04-11 DIAGNOSIS — T1490XA Injury, unspecified, initial encounter: Secondary | ICD-10-CM

## 2021-04-11 DIAGNOSIS — S29011A Strain of muscle and tendon of front wall of thorax, initial encounter: Secondary | ICD-10-CM | POA: Insufficient documentation

## 2021-04-11 DIAGNOSIS — Z7902 Long term (current) use of antithrombotics/antiplatelets: Secondary | ICD-10-CM | POA: Diagnosis not present

## 2021-04-11 DIAGNOSIS — I509 Heart failure, unspecified: Secondary | ICD-10-CM | POA: Insufficient documentation

## 2021-04-11 DIAGNOSIS — Z951 Presence of aortocoronary bypass graft: Secondary | ICD-10-CM | POA: Diagnosis not present

## 2021-04-11 DIAGNOSIS — Z87891 Personal history of nicotine dependence: Secondary | ICD-10-CM | POA: Insufficient documentation

## 2021-04-11 DIAGNOSIS — I110 Hypertensive heart disease with heart failure: Secondary | ICD-10-CM | POA: Diagnosis not present

## 2021-04-11 DIAGNOSIS — T148XXA Other injury of unspecified body region, initial encounter: Secondary | ICD-10-CM

## 2021-04-11 DIAGNOSIS — W19XXXA Unspecified fall, initial encounter: Secondary | ICD-10-CM

## 2021-04-11 MED ORDER — OXYCODONE-ACETAMINOPHEN 5-325 MG PO TABS
1.0000 | ORAL_TABLET | Freq: Once | ORAL | Status: DC
  Filled 2021-04-11: qty 1

## 2021-04-11 MED ORDER — ACETAMINOPHEN 325 MG PO TABS
650.0000 mg | ORAL_TABLET | Freq: Once | ORAL | Status: AC
  Administered 2021-04-11: 650 mg via ORAL
  Filled 2021-04-11: qty 2

## 2021-04-11 NOTE — ED Provider Notes (Addendum)
[facility] EMERGENCY DEPARTMENT Provider Note   CSN: [id number] Arrival date & time: 04/11/21  [id number]     History Chief Complaint  Patient presents with   Fall   Rib Injury    Melissa Cardenas is a 87 y.o. female.  Presents to ER with concern for fall.  Patient states that she tripped going over a doorway and hit the side of the door frame on her left side.  States that she is having pain in her left ribs.  She consistently denies ever hitting her head.  Did not lose consciousness.  Does take Xarelto.  States that she feels that her breathing is at baseline and denies any difficulty in breathing.  She denies having any upper back pain or trauma to her upper back.  No numbness or weakness or pain in her extremities. History of A. fib, on Xarelto.  HPI     Past Medical History:  Diagnosis Date   A-fib (HCC)    Anemia    Arthritis    Blood transfusion    CHF (congestive heart failure) (HCC)    Heart attack (HCC)    Hypertension    Stroke [facility](HCC)    Thyroid disease     Patient Active Problem List   Diagnosis Date Noted   Cerebrovascular accident (CVA) (HCC)    Other hyperlipidemia    Essential hypertension    CVA (cerebral vascular accident) (HCC) 11/05/2016    Past Surgical History:  Procedure Laterality Date   BREAST BIOPSY     cabg  1990   CORONARY ARTERY BYPASS GRAFT       OB History   No obstetric history on file.     Family History  Problem Relation Age of Onset   Heart attack Son        2 sons passed from heart attack   Hyperlipidemia Daughter     Social History   Tobacco Use   Smoking status: Former    Types: Cigarettes    Start date: 1945    Quit date: 1977    Years since quitting: 45.5   Smokeless tobacco: Never   Tobacco comments:    12-14 cigarettes per day from age [age]  Vaping Use   Vaping Use: Never used  Substance Use Topics   Alcohol use: Not Currently    Comment: rare   Drug use: No    Home Medications Prior to  Admission medications   Medication Sig Start Date End Date Taking? Authorizing Provider  dexamethasone (DECADRON) 0.75 MG tablet Take 0.75 mg by mouth 2 (two) times daily.   Yes [provider]  hydrOXYzine (ATARAX/VISTARIL) 25 MG tablet Take 25 mg by mouth at bedtime.   Yes [provider]  loratadine (CLARITIN REDITABS) 10 MG dissolvable tablet Take 10 mg by mouth in the morning and at bedtime.   Yes [provider]  carvedilol (COREG) 6.25 MG tablet Take 3.125 mg by mouth 2 (two) times daily with a meal.     [provider]  ciprofloxacin (CIPRO) 500 MG tablet Take 1 tablet (500 mg total) by mouth 2 (two) times daily. Patient not taking: Reported on 11/05/2016 09/24/16   Rolland PorterJames, Mark, MD  clopidogrel (PLAVIX) 75 MG tablet Take 1 tablet (75 mg total) by mouth daily. 11/07/16   Thomasene Lotaylor, James, MD  Hyoscyamine Sulfate SL 0.125 MG SUBL Place 0.125 mg under the tongue as needed (for stomach).     [provider]  Multiple Vitamin (MULTIVITAMIN WITH  MINERALS) TABS tablet Take 1 tablet by mouth daily.    [provider]  Multiple Vitamins-Minerals (PRESERVISION AREDS PO) Take 1 tablet by mouth daily.    [provider]  nitroGLYCERIN (NITROSTAT) 0.4 MG SL tablet Place 0.4 mg under the tongue every 5 (five) minutes as needed for chest pain.    [provider]  ondansetron (ZOFRAN ODT) 4 MG disintegrating tablet Take 1 tablet (4 mg total) by mouth every 8 (eight) hours as needed for nausea. 08/27/19   Long, Arlyss Repress, MD  phenazopyridine (PYRIDIUM) 200 MG tablet Take 1 tablet (200 mg total) by mouth 3 (three) times daily. Patient not taking: Reported on 11/05/2016 09/24/16   Rolland Porter, MD  polyethylene glycol powder (GLYCOLAX/MIRALAX) powder Take 17 g by mouth daily.      [provider]  ranolazine (RANEXA) 500 MG 12 hr tablet Take 500 mg by mouth 2 (two) times daily.      [provider]  rosuvastatin (CRESTOR) 40 MG  tablet Take 40 mg by mouth daily.      [provider]    Allergies    Atromid s [clofibrate], Avelox [moxifloxacin hydrochloride], Nitrofuran derivatives, Other, Penicillins, Sulfa antibiotics, Apixaban, and Tetracycline  Review of Systems   Review of Systems  Constitutional:  Negative for chills and fever.  HENT:  Negative for ear pain and sore throat.   Eyes:  Negative for pain and visual disturbance.  Respiratory:  Negative for cough and shortness of breath.   Cardiovascular:  Positive for chest pain. Negative for palpitations.  Gastrointestinal:  Negative for abdominal pain and vomiting.  Genitourinary:  Negative for dysuria and hematuria.  Musculoskeletal:  Negative for arthralgias and back pain.  Skin:  Negative for color change and rash.  Neurological:  Negative for seizures and syncope.  All other systems reviewed and are negative.  Physical Exam Updated Vital Signs BP (!) 168/96 (BP Location: Right Arm)   Pulse 78   Temp 97.8 F (36.6 C) (Oral)   Resp 20   Ht 4\' 11"  (1.499 m)   Wt 49.9 kg   SpO2 100%   BMI 22.22 kg/m   Physical Exam Vitals and nursing note reviewed.  Constitutional:      General: She is not in acute distress.    Appearance: She is well-developed.  HENT:     Head: Normocephalic and atraumatic.  Eyes:     Conjunctiva/sclera: Conjunctivae normal.  Cardiovascular:     Rate and Rhythm: Normal rate and regular rhythm.     Heart sounds: No murmur heard. Pulmonary:     Effort: Pulmonary effort is normal. No respiratory distress.     Breath sounds: Normal breath sounds.     Comments: There is some TTP over the L lateral chest wall Abdominal:     Palpations: Abdomen is soft.     Tenderness: There is no abdominal tenderness.  Musculoskeletal:     Cervical back: Neck supple.     Comments: Back: no C, T, L spine TTP, no step off or deformity RUE: no TTP throughout, no deformity, normal joint ROM, radial pulse intact, distal sensation and  motor intact LUE: no TTP throughout, no deformity, normal joint ROM, radial pulse intact, distal sensation and motor intact RLE:  no TTP throughout, no deformity, normal joint ROM, distal pulse, sensation and motor intact LLE: no TTP throughout, no deformity, normal joint ROM, distal pulse, sensation and motor intact  Skin:    General: Skin is warm and  dry.  Neurological:     General: No focal deficit present.     Mental Status: She is alert and oriented to person, place, and time.    ED Results / Procedures / Treatments   Labs (all labs ordered are listed, but only abnormal results are displayed) Labs Reviewed - No data to display  EKG None  Radiology DG Ribs Unilateral W/Chest Left  Result Date: 04/11/2021 CLINICAL DATA:  87 year old female status post fall with left posterior lower rib pain. EXAM: LEFT RIBS AND CHEST - 3+ VIEW COMPARISON:  Chest radiographs 01/17/2021 and earlier. FINDINGS: Stable cardiomegaly and mediastinal contours. Prior sternotomy. Calcified aortic atherosclerosis. Visualized tracheal air column is within normal limits. No pneumothorax, pulmonary edema, pleural effusion or acute pulmonary opacity. Osteopenia limits lower rib detail. The posterior left 11th or 12th rib level is marked as the area of clinical concern. No acute displaced left rib fracture is identified. Chronic T12 compression fracture again noted. Negative visible bowel gas pattern. IMPRESSION: 1. Osteopenia.  No acute displaced left rib fracture identified. 2. No acute cardiopulmonary abnormality. Chronic cardiomegaly. Aortic Atherosclerosis (ICD10-I70.0). 3. Chronic T12 compression fracture. Electronically Signed   By: Odessa Fleming M.D.   On: 04/11/2021 11:27   CT CHEST ABDOMEN PELVIS WO CONTRAST  Result Date: 04/11/2021 CLINICAL DATA:  Left flank pain after fall. EXAM: CT CHEST, ABDOMEN AND PELVIS WITHOUT CONTRAST TECHNIQUE: Multidetector CT imaging of the chest, abdomen and pelvis was performed  following the standard protocol without IV contrast. COMPARISON:  June 13, 2019.  January 17, 2021. FINDINGS: CT CHEST FINDINGS Cardiovascular: Atherosclerosis of thoracic aorta is noted without aneurysm formation. Moderate cardiomegaly is noted. Coronary artery calcifications are noted. No pericardial effusion is noted. Mediastinum/Nodes: No enlarged mediastinal, hilar, or axillary lymph nodes. Thyroid gland, trachea, and esophagus demonstrate no significant findings. Lungs/Pleura: No pneumothorax or pleural effusion is noted. Mild left basilar subsegmental atelectasis is noted. Musculoskeletal: Moderate compression deformity of upper thoracic vertebral body is noted consistent with fracture of indeterminate age. CT ABDOMEN PELVIS FINDINGS Hepatobiliary: No focal liver abnormality is seen. No gallstones, gallbladder wall thickening, or biliary dilatation. Pancreas: Unremarkable. No pancreatic ductal dilatation or surrounding inflammatory changes. Spleen: Normal in size without focal abnormality. Adrenals/Urinary Tract: Adrenal glands are unremarkable. Kidneys are normal, without renal calculi, focal lesion, or hydronephrosis. Bladder is unremarkable. Stomach/Bowel: Stomach appears normal. There is no evidence of bowel obstruction or inflammation. Sigmoid diverticulosis is noted without inflammation. Stool is noted throughout the colon. Vascular/Lymphatic: Aortic atherosclerosis. No enlarged abdominal or pelvic lymph nodes. Reproductive: Uterus and bilateral adnexa are unremarkable. Other: No abdominal wall hernia or abnormality. No abdominopelvic ascites. Musculoskeletal: No acute or significant osseous findings. IMPRESSION: Moderate compression deformity of upper thoracic vertebral body is noted consistent with fracture of indeterminate age. Coronary artery calcifications are noted. Mild left basilar subsegmental atelectasis. Sigmoid diverticulosis without inflammation. Stool is noted throughout the colon.  Aortic Atherosclerosis (ICD10-I70.0). Electronically Signed   By: Lupita Raider M.D.   On: 04/11/2021 12:54    Procedures Procedures   Medications Ordered in ED Medications  oxyCODONE-acetaminophen (PERCOCET/ROXICET) 5-325 MG per tablet 1 tablet (1 tablet Oral Patient Refused/Not Given 04/11/21 1034)  acetaminophen (TYLENOL) tablet 650 mg (650 mg Oral Given 04/11/21 1042)    ED Course  I have reviewed the triage vital signs and the nursing notes.  Pertinent labs & imaging results that were available during my care of the patient were reviewed by me and considered in my medical decision making (see  chart for details).    MDM Rules/Calculators/A&P                          87 year old lady presents to ER after fall.  Complaining of left-sided chest wall pain.  On physical exam, patient was well-appearing in no apparent distress.  On initial triage, RN reported patient was placed on 2 L nasal cannula due to report of work of breathing.  Patient states that she feels her breathing is at baseline, she does not appear to be in any respiratory distress.  Personally removed oxygen and observe patient, she did not have any desaturation on room air.  She was focally tender over her left lower ribs.  Did not have focal tenderness in her abdomen.  No hematuria.  Initial x-ray was negative.  Given the focal tenderness, age, osteopenia limiting plain films sensitivity, checked CT.  No CT evidence of rib fractures, no other injuries identified except for possible thoracic compression fracture of indeterminate age.  Patient did not have any focal tenderness in this region.  Discussed this finding with patient, does not recall any specific injury to this area.  As patient is asymptomatic, suspect this is more chronic in nature.  Recommend patient follow-up with her primary doctor, reviewed return precautions and discharged home.    After the discussed management above, the patient was determined to be safe for  discharge.  The patient was in agreement with this plan and all questions regarding their care were answered.  ED return precautions were discussed and the patient will return to the ED with any significant worsening of condition.  Final Clinical Impression(s) / ED Diagnoses Final diagnoses:  Fall, initial encounter  Muscle strain    Rx / DC Orders ED Discharge Orders     None        Milagros Loll, MD 04/12/21 1152    Milagros Loll, MD 04/12/21 1153

## 2021-04-11 NOTE — ED Triage Notes (Addendum)
Patient fell this morning after tripping in doorway, sliding down door frame onto space heater injuring "between my ribs and my hips, toward the back"  patient placed on 2 L [place] d/t work of breathing.  Patient takes Xarelto.

## 2021-04-11 NOTE — Discharge Instructions (Addendum)
Follow-up with your primary care doctor.  Take Tylenol for pain control.  Come back to ER if your pain is uncontrolled, you develop abdominal pain, generalized chest pain, any difficulty breathing, fever or other new concerning symptom.

## 2021-08-17 ENCOUNTER — Inpatient Hospital Stay (HOSPITAL_COMMUNITY)
Admission: EM | Admit: 2021-08-17 | Discharge: 2021-08-20 | DRG: 640 | Disposition: A | Discharge status: Hospice | Payer: Medicare Other | Attending: Family Medicine | Admitting: Family Medicine

## 2021-08-17 ENCOUNTER — Emergency Department (HOSPITAL_COMMUNITY): Payer: Medicare Other

## 2021-08-17 ENCOUNTER — Other Ambulatory Visit: Payer: Self-pay

## 2021-08-17 DIAGNOSIS — Z951 Presence of aortocoronary bypass graft: Secondary | ICD-10-CM

## 2021-08-17 DIAGNOSIS — I252 Old myocardial infarction: Secondary | ICD-10-CM

## 2021-08-17 DIAGNOSIS — Z66 Do not resuscitate: Secondary | ICD-10-CM | POA: Diagnosis present

## 2021-08-17 DIAGNOSIS — T501X5A Adverse effect of loop [high-ceiling] diuretics, initial encounter: Secondary | ICD-10-CM | POA: Diagnosis present

## 2021-08-17 DIAGNOSIS — Z880 Allergy status to penicillin: Secondary | ICD-10-CM

## 2021-08-17 DIAGNOSIS — Z7901 Long term (current) use of anticoagulants: Secondary | ICD-10-CM

## 2021-08-17 DIAGNOSIS — Z888 Allergy status to other drugs, medicaments and biological substances status: Secondary | ICD-10-CM

## 2021-08-17 DIAGNOSIS — D649 Anemia, unspecified: Secondary | ICD-10-CM

## 2021-08-17 DIAGNOSIS — Z20822 Contact with and (suspected) exposure to covid-19: Secondary | ICD-10-CM | POA: Diagnosis present

## 2021-08-17 DIAGNOSIS — I2510 Atherosclerotic heart disease of native coronary artery without angina pectoris: Secondary | ICD-10-CM | POA: Diagnosis present

## 2021-08-17 DIAGNOSIS — I5043 Acute on chronic combined systolic (congestive) and diastolic (congestive) heart failure: Secondary | ICD-10-CM | POA: Diagnosis present

## 2021-08-17 DIAGNOSIS — Z7902 Long term (current) use of antithrombotics/antiplatelets: Secondary | ICD-10-CM

## 2021-08-17 DIAGNOSIS — Z515 Encounter for palliative care: Secondary | ICD-10-CM

## 2021-08-17 DIAGNOSIS — I5033 Acute on chronic diastolic (congestive) heart failure: Secondary | ICD-10-CM | POA: Diagnosis not present

## 2021-08-17 DIAGNOSIS — I4821 Permanent atrial fibrillation: Secondary | ICD-10-CM | POA: Diagnosis present

## 2021-08-17 DIAGNOSIS — Z881 Allergy status to other antibiotic agents status: Secondary | ICD-10-CM

## 2021-08-17 DIAGNOSIS — E079 Disorder of thyroid, unspecified: Secondary | ICD-10-CM | POA: Diagnosis present

## 2021-08-17 DIAGNOSIS — I110 Hypertensive heart disease with heart failure: Secondary | ICD-10-CM | POA: Diagnosis present

## 2021-08-17 DIAGNOSIS — E873 Alkalosis: Secondary | ICD-10-CM | POA: Diagnosis present

## 2021-08-17 DIAGNOSIS — Z8673 Personal history of transient ischemic attack (TIA), and cerebral infarction without residual deficits: Secondary | ICD-10-CM

## 2021-08-17 DIAGNOSIS — I272 Pulmonary hypertension, unspecified: Secondary | ICD-10-CM | POA: Diagnosis present

## 2021-08-17 DIAGNOSIS — G2581 Restless legs syndrome: Secondary | ICD-10-CM | POA: Diagnosis present

## 2021-08-17 DIAGNOSIS — Z87891 Personal history of nicotine dependence: Secondary | ICD-10-CM

## 2021-08-17 DIAGNOSIS — E871 Hypo-osmolality and hyponatremia: Principal | ICD-10-CM | POA: Diagnosis present

## 2021-08-17 DIAGNOSIS — J9611 Chronic respiratory failure with hypoxia: Secondary | ICD-10-CM | POA: Diagnosis present

## 2021-08-17 DIAGNOSIS — R531 Weakness: Secondary | ICD-10-CM

## 2021-08-17 DIAGNOSIS — Z79899 Other long term (current) drug therapy: Secondary | ICD-10-CM

## 2021-08-17 DIAGNOSIS — E876 Hypokalemia: Secondary | ICD-10-CM | POA: Diagnosis present

## 2021-08-17 DIAGNOSIS — K5730 Diverticulosis of large intestine without perforation or abscess without bleeding: Secondary | ICD-10-CM | POA: Diagnosis present

## 2021-08-17 DIAGNOSIS — Z882 Allergy status to sulfonamides status: Secondary | ICD-10-CM

## 2021-08-17 LAB — POC OCCULT BLOOD, ED: Fecal Occult Bld: NEGATIVE

## 2021-08-17 LAB — COMPREHENSIVE METABOLIC PANEL
ALT: 22 U/L (ref 0–44)
AST: 34 U/L (ref 15–41)
Albumin: 3.4 g/dL — ABNORMAL LOW (ref 3.5–5.0)
Alkaline Phosphatase: 71 U/L (ref 38–126)
Anion gap: 6 (ref 5–15)
BUN: 14 mg/dL (ref 8–23)
CO2: 33 mmol/L — ABNORMAL HIGH (ref 22–32)
Calcium: 9.1 mg/dL (ref 8.9–10.3)
Chloride: 81 mmol/L — ABNORMAL LOW (ref 98–111)
Creatinine, Ser: 0.8 mg/dL (ref 0.44–1.00)
GFR, Estimated: >60 mL/min (ref >60)
Glucose, Bld: 104 mg/dL — ABNORMAL HIGH (ref 70–99)
Potassium: 4.4 mmol/L (ref 3.5–5.1)
Sodium: 120 mmol/L — ABNORMAL LOW (ref 135–145)
Total Bilirubin: 1.1 mg/dL (ref 0.3–1.2)
Total Protein: 6.8 g/dL (ref 6.5–8.1)

## 2021-08-17 LAB — URINALYSIS, ROUTINE W REFLEX MICROSCOPIC
Bilirubin Urine: NEGATIVE
Glucose, UA: NEGATIVE mg/dL
Hgb urine dipstick: NEGATIVE
Ketones, ur: 5 mg/dL — AB
Nitrite: NEGATIVE
Protein, ur: 100 mg/dL — AB
Specific Gravity, Urine: 1.021 (ref 1.005–1.030)
WBC, UA: >50 WBC/hpf — ABNORMAL HIGH (ref 0–5)
pH: 5 (ref 5.0–8.0)

## 2021-08-17 LAB — CBC WITH DIFFERENTIAL/PLATELET
Abs Immature Granulocytes: 0.02 10*3/uL (ref 0.00–0.07)
Basophils Absolute: 0 10*3/uL (ref 0.0–0.1)
Basophils Relative: 0 %
Eosinophils Absolute: 0 10*3/uL (ref 0.0–0.5)
Eosinophils Relative: 1 %
HCT: 31.5 % — ABNORMAL LOW (ref 36.0–46.0)
Hemoglobin: 10.1 g/dL — ABNORMAL LOW (ref 12.0–15.0)
Immature Granulocytes: 0 %
Lymphocytes Relative: 6 %
Lymphs Abs: 0.4 10*3/uL — ABNORMAL LOW (ref 0.7–4.0)
MCH: 29.7 pg (ref 26.0–34.0)
MCHC: 32.1 g/dL (ref 30.0–36.0)
MCV: 92.6 fL (ref 80.0–100.0)
Monocytes Absolute: 0.4 10*3/uL (ref 0.1–1.0)
Monocytes Relative: 7 %
Neutro Abs: 5.7 10*3/uL (ref 1.7–7.7)
Neutrophils Relative %: 86 %
Platelets: 160 10*3/uL (ref 150–400)
RBC: 3.4 MIL/uL — ABNORMAL LOW (ref 3.87–5.11)
RDW: 16.1 % — ABNORMAL HIGH (ref 11.5–15.5)
WBC: 6.6 10*3/uL (ref 4.0–10.5)
nRBC: 0 % (ref 0.0–0.2)

## 2021-08-17 LAB — RESP PANEL BY RT-PCR (FLU A&B, COVID) ARPGX2
Influenza A by PCR: NEGATIVE
Influenza B by PCR: NEGATIVE
SARS Coronavirus 2 by RT PCR: NEGATIVE

## 2021-08-17 LAB — BRAIN NATRIURETIC PEPTIDE: B Natriuretic Peptide: 743.9 pg/mL — ABNORMAL HIGH (ref 0.0–100.0)

## 2021-08-17 LAB — OSMOLALITY, URINE: Osmolality, Ur: 555 mOsm/kg (ref 300–900)

## 2021-08-17 LAB — OSMOLALITY: Osmolality: 259 mOsm/kg — ABNORMAL LOW (ref 275–295)

## 2021-08-17 LAB — LIPASE, BLOOD: Lipase: 21 U/L (ref 11–51)

## 2021-08-17 LAB — SODIUM, URINE, RANDOM: Sodium, Ur: <10 mmol/L

## 2021-08-17 MED ORDER — SODIUM CHLORIDE 0.9% FLUSH
3.0000 mL | Freq: Two times a day (BID) | INTRAVENOUS | Status: DC
  Administered 2021-08-17 – 2021-08-20 (×5): 3 mL via INTRAVENOUS

## 2021-08-17 MED ORDER — SENNOSIDES-DOCUSATE SODIUM 8.6-50 MG PO TABS: 1.0000 | ORAL_TABLET | Freq: Every evening | ORAL | Status: DC | PRN

## 2021-08-17 MED ORDER — ACETAMINOPHEN 325 MG PO TABS: 650.0000 mg | ORAL_TABLET | Freq: Four times a day (QID) | ORAL | Status: DC | PRN

## 2021-08-17 MED ORDER — FUROSEMIDE 10 MG/ML IJ SOLN
20.0000 mg | Freq: Once | INTRAMUSCULAR | Status: AC
  Administered 2021-08-17: 20 mg via INTRAVENOUS
  Filled 2021-08-17: qty 2

## 2021-08-17 MED ORDER — ROSUVASTATIN CALCIUM 20 MG PO TABS: 40.0000 mg | ORAL_TABLET | Freq: Every evening | ORAL | Status: DC

## 2021-08-17 MED ORDER — RIVAROXABAN 15 MG PO TABS
15.0000 mg | ORAL_TABLET | Freq: Every evening | ORAL | Status: DC
  Administered 2021-08-17 – 2021-08-19 (×3): 15 mg via ORAL
  Filled 2021-08-17 (×3): qty 1

## 2021-08-17 MED ORDER — SODIUM CHLORIDE 0.9 % IV BOLUS
500.0000 mL | Freq: Once | INTRAVENOUS | Status: AC
  Administered 2021-08-17: 500 mL via INTRAVENOUS

## 2021-08-17 MED ORDER — ONDANSETRON HCL 4 MG PO TABS: 4.0000 mg | ORAL_TABLET | Freq: Four times a day (QID) | ORAL | Status: DC | PRN

## 2021-08-17 MED ORDER — ROPINIROLE HCL 1 MG PO TABS
1.0000 mg | ORAL_TABLET | Freq: Every day | ORAL | Status: DC
  Administered 2021-08-17 – 2021-08-19 (×3): 1 mg via ORAL
  Filled 2021-08-17 (×6): qty 1

## 2021-08-17 MED ORDER — ACETAMINOPHEN 650 MG RE SUPP: 650.0000 mg | Freq: Four times a day (QID) | RECTAL | Status: DC | PRN

## 2021-08-17 MED ORDER — PANTOPRAZOLE SODIUM 40 MG PO TBEC
40.0000 mg | DELAYED_RELEASE_TABLET | Freq: Every day | ORAL | Status: DC
  Administered 2021-08-18 – 2021-08-20 (×3): 40 mg via ORAL
  Filled 2021-08-17 (×3): qty 1

## 2021-08-17 MED ORDER — CARVEDILOL 6.25 MG PO TABS
6.2500 mg | ORAL_TABLET | Freq: Two times a day (BID) | ORAL | Status: DC
  Administered 2021-08-18 – 2021-08-20 (×5): 6.25 mg via ORAL
  Filled 2021-08-17 (×5): qty 1

## 2021-08-17 MED ORDER — ONDANSETRON HCL 4 MG/2ML IJ SOLN
4.0000 mg | Freq: Four times a day (QID) | INTRAMUSCULAR | Status: DC | PRN
  Administered 2021-08-17: 4 mg via INTRAVENOUS
  Filled 2021-08-17: qty 2

## 2021-08-17 MED ORDER — SODIUM CHLORIDE 0.9 % IV SOLN: INTRAVENOUS | Status: DC

## 2021-08-17 MED ORDER — RANOLAZINE ER 500 MG PO TB12
500.0000 mg | ORAL_TABLET | Freq: Two times a day (BID) | ORAL | Status: DC
  Administered 2021-08-17 – 2021-08-20 (×6): 500 mg via ORAL
  Filled 2021-08-17 (×6): qty 1

## 2021-08-17 MED ORDER — ONDANSETRON HCL 4 MG/2ML IJ SOLN
4.0000 mg | Freq: Once | INTRAMUSCULAR | Status: AC
  Administered 2021-08-17: 4 mg via INTRAVENOUS
  Filled 2021-08-17: qty 2

## 2021-08-17 MED ORDER — ALBUTEROL SULFATE (2.5 MG/3ML) 0.083% IN NEBU: 3.0000 mL | INHALATION_SOLUTION | Freq: Four times a day (QID) | RESPIRATORY_TRACT | Status: DC | PRN

## 2021-08-17 NOTE — ED Triage Notes (Signed)
BIB EMS from home after increased generalized weakness and swelling in right lower leg, pt was here last week for N/V and was told "to quit taking her lasix," per pt. EMS reports a small amount of pink-tinged mucous when coughing

## 2021-08-17 NOTE — H&P (Signed)
History and Physical    [name]:[id number] DOB: Jun 07, 1924 DOA: 08/17/2021  PCP: Kristopher Glee., MD  Patient coming from: Home via EMS  I have personally briefly reviewed patient's old medical records in [place]  Chief Complaint: Generalized weakness  HPI: Melissa Cardenas is a 87 y.o. female with medical history significant for chronic diastolic CHF with severe pulmonary hypertension and RV failure, chronic hypoxic respiratory failure on 2-2.5 L O2 via [place], permanent atrial fibrillation on Xarelto, CAD s/p CABG, history of CVA, chronic hyponatremia, restless leg syndrome who presented to the ED for evaluation of generalized weakness.  Patient recently admitted at [facility] 08/12/2021-08/13/2021.  She initially presented for evaluation of abdominal pain.  CT abdomen/pelvis with contrast on 11/18 was negative for acute abdominal pelvic findings.  Extensive colonic diverticulosis without diverticulitis, small right pleural effusion, cardiomegaly with marked dilatation of right atrium, and hepatic steatosis noted.  She was found to be hyponatremic with sodium 121.  Her home Lasix was held and she was treated with IV fluids overnight.  Repeat sodium was 126 and patient was discharged to home with hospitalist at home follow-up.  Patient was seen by telehospitalist on 11/21.  Repeat BMP was obtained and showed sodium 125.  Patient was advised to continue to hold diuretics.  She was scheduled for follow-up visit on 11/25.  Patient states that she was feeling okay for 1 day after she left the hospital however she has otherwise been feeling progressively weaker and fatigued.  She has had low appetite and not eating much.  She has been experiencing nausea without emesis.  She has had frequent cough productive of clear sputum, sometimes seeing any pink or red spots mixed in.  She reports having decreased urine output recently which appears dark.  She denies any loose stools or diarrhea.  She  denies any chest pain.  She states she does not have much shortness of breath when resting but does appear to have increased work of breathing compared to her baseline per daughter.  ED Course:  Initial vitals show BP 127/71, pulse 84, RR 21, temp 97.9 F, SPO2 100% on 3 L supplemental O2 via .  Labs show sodium 120, potassium 4.4, chloride 81, bicarb 33, BUN 14, creatinine 0.80, serum glucose 104, AST 34, ALT 22, alk phos 71, total bilirubin 1.1, lipase 21, WBC 6.6, hemoglobin 10.1, platelets 160,000.  COVID and influenza PCR negative.  1 view chest x-ray showed increased cardiomegaly, mild pulmonary edema, possible underlying pericardial effusion although exam limited due to AP portable technique and patient rotation.  Abdominal x-ray shows nonobstructive bowel gas pattern.  Patient was given 500 cc normal saline followed by 100 mL/hour continuous fluids.  The hospitalist service was consulted to admit for further evaluation and management.  Review of Systems: All systems reviewed and are negative except as documented in history of present illness above.   Past Medical History:  Diagnosis Date   A-fib ([place])    Anemia    Arthritis    Blood transfusion    CHF (congestive heart failure) ([place])    Heart attack ([place])    Hypertension    Stroke ([place])    Thyroid disease     Past Surgical History:  Procedure Laterality Date   BREAST BIOPSY     cabg  1990   CORONARY ARTERY BYPASS GRAFT      Social History:  reports that she quit smoking about 45 years ago. Her smoking use included cigarettes. She started  smoking about 77 years ago. She has never used smokeless tobacco. She reports that she does not currently use alcohol. She reports that she does not use drugs.  Allergies  Allergen Reactions   Atromid S [Clofibrate]     unknown   Avelox [Moxifloxacin Hydrochloride]     whoosy and out of head   Nitrofuran Derivatives     unknown   Other     tetrosin allergy Unknown     Penicillins Hives and Itching   Sulfa Antibiotics Swelling   Apixaban Rash   Tetracycline Rash    Family History  Problem Relation Age of Onset   Heart attack Son        2 sons passed from heart attack   Hyperlipidemia Daughter      Prior to Admission medications   Medication Sig Start Date End Date Taking? Authorizing Provider  albuterol (VENTOLIN HFA) 108 (90 Base) MCG/ACT inhaler Inhale 2 puffs into the lungs every 6 (six) hours as needed for wheezing or shortness of breath. 03/10/21  Yes [provider]  carvedilol (COREG) 12.5 MG tablet Take 6.25 mg by mouth 2 (two) times daily. Take 1/2 tablet (6.25 mg) BID 08/15/21  Yes [provider]  cholecalciferol (VITAMIN D3) 25 MCG (1000 UNIT) tablet Take 1,000 Units by mouth daily.   Yes [provider]  Cholecalciferol 100 MCG (4000 UT) CAPS Take 1 capsule by mouth daily.   Yes [provider]  DIPHENHYDRAMINE HCL, TOPICAL, 2 % SOLN Apply 1 application topically daily as needed (itching).   Yes [provider]  FEROSUL 325 (65 Fe) MG tablet Take 325 mg by mouth 2 (two) times daily. 08/15/21  Yes [provider]  hydrocortisone 2.5 % cream Apply 1 application topically 2 (two) times daily as needed (itching). 05/31/21  Yes [provider]  Hyoscyamine Sulfate SL 0.125 MG SUBL Place 0.125 mg under the tongue as needed (for stomach).    Yes [provider]  Multiple Vitamin (MULTIVITAMIN WITH MINERALS) TABS tablet Take 1 tablet by mouth daily.   Yes [provider]  Multiple Vitamins-Minerals (PRESERVISION AREDS PO) Take 1 tablet by mouth daily.   Yes [provider]  nitroGLYCERIN (NITROSTAT) 0.4 MG SL tablet Place 0.4 mg under the tongue every 5 (five) minutes as needed for chest pain.   Yes [provider]  omeprazole (PRILOSEC) 20 MG capsule Take 20 mg by mouth daily. 07/14/21  Yes [provider]  ondansetron (ZOFRAN ODT) 4 MG  disintegrating tablet Take 1 tablet (4 mg total) by mouth every 8 (eight) hours as needed for nausea. 08/27/19  Yes Long, Wonda Olds, MD  polyethylene glycol powder (GLYCOLAX/MIRALAX) powder Take 17 g by mouth daily as needed for moderate constipation.   Yes [provider]  ranolazine (RANEXA) 500 MG 12 hr tablet Take 500 mg by mouth 2 (two) times daily.     Yes [provider]  rOPINIRole (REQUIP) 0.5 MG tablet Take 1 mg by mouth at bedtime. 05/18/21  Yes [provider]  rosuvastatin (CRESTOR) 40 MG tablet Take 40 mg by mouth every evening.   Yes [provider]  traMADol (ULTRAM) 50 MG tablet Take 50 mg by mouth every 6 (six) hours as needed for moderate pain. 08/16/21  Yes [provider]  triamcinolone cream (KENALOG) 0.1 % Apply 1 application topically 2 (two) times daily as needed (itching). 04/08/21  Yes [provider]  XARELTO 15 MG TABS tablet Take 15 mg by  mouth every evening. 07/29/21  Yes [provider]  ciprofloxacin (CIPRO) 500 MG tablet Take 1 tablet (500 mg total) by mouth 2 (two) times daily. Patient not taking: Reported on 11/05/2016 09/24/16   Tanna Furry, MD  clopidogrel (PLAVIX) 75 MG tablet Take 1 tablet (75 mg total) by mouth daily. Patient not taking: Reported on 08/17/2021 11/07/16   Ophelia Shoulder, MD  furosemide (LASIX) 40 MG tablet Take 40 mg by mouth 2 (two) times daily. 07/11/21   [provider]  phenazopyridine (PYRIDIUM) 200 MG tablet Take 1 tablet (200 mg total) by mouth 3 (three) times daily. Patient not taking: Reported on 11/05/2016 09/24/16   Tanna Furry, MD    Physical Exam: Vitals:   08/17/21 1800 08/17/21 1815 08/17/21 1830 08/17/21 1900  BP: 112/64 112/63 104/76 105/63  Pulse: (!) 58 76 72 77  Resp: 16 14 14 19   Temp:      SpO2: 99% 100% 99% 98%  Weight:      Height:       Constitutional: Elderly woman resting in bed with head slightly elevated, appears fatigued but in NAD, calm,  comfortable Eyes: PERRL, lids and conjunctivae normal ENMT: Mucous membranes are moist. Posterior pharynx clear of any exudate or lesions.Normal dentition.  Neck: normal, supple, no masses. Respiratory: Inspiratory crackles throughout the lung fields.  Increased respiratory effort. No accessory muscle use.  Cardiovascular: Irregularly irregular, no murmurs / rubs / gallops.  Trace lower extremity edema. 2+ pedal pulses. Abdomen: no tenderness, no masses palpated. No hepatosplenomegaly.  Musculoskeletal: no clubbing / cyanosis. No joint deformity upper and lower extremities. Good ROM, no contractures. Normal muscle tone.  Skin: no rashes, lesions, ulcers. No induration Neurologic: CN 2-12 grossly intact. Sensation intact. Strength 5/5 in all 4.  Psychiatric: Normal judgment and insight. Alert and oriented x 3. Normal mood.   Labs on Admission: I have personally reviewed following labs and imaging studies  CBC: Recent Labs  Lab 08/17/21 1710  WBC 6.6  NEUTROABS 5.7  HGB 10.1*  HCT 31.5*  MCV 92.6  PLT 909   Basic Metabolic Panel: Recent Labs  Lab 08/17/21 1710  NA 120*  K 4.4  CL 81*  CO2 33*  GLUCOSE 104*  BUN 14  CREATININE 0.80  CALCIUM 9.1   GFR: Estimated Creatinine Clearance: 27.1 mL/min (by C-G formula based on SCr of 0.8 mg/dL). Liver Function Tests: Recent Labs  Lab 08/17/21 1710  AST 34  ALT 22  ALKPHOS 71  BILITOT 1.1  PROT 6.8  ALBUMIN 3.4*   Recent Labs  Lab 08/17/21 1710  LIPASE 21   No results for input(s): AMMONIA in the last 168 hours. Coagulation Profile: No results for input(s): INR, PROTIME in the last 168 hours. Cardiac Enzymes: No results for input(s): CKTOTAL, CKMB, CKMBINDEX, TROPONINI in the last 168 hours. BNP (last 3 results) No results for input(s): PROBNP in the last 8760 hours. HbA1C: No results for input(s): HGBA1C in the last 72 hours. CBG: No results for input(s): GLUCAP in the last 168 hours. Lipid Profile: No results  for input(s): CHOL, HDL, LDLCALC, TRIG, CHOLHDL, LDLDIRECT in the last 72 hours. Thyroid Function Tests: No results for input(s): TSH, T4TOTAL, FREET4, T3FREE, THYROIDAB in the last 72 hours. Anemia Panel: No results for input(s): VITAMINB12, FOLATE, FERRITIN, TIBC, IRON, RETICCTPCT in the last 72 hours. Urine analysis:    Component Value Date/Time   COLORURINE YELLOW 03/04/2019 1206   APPEARANCEUR CLEAR 03/04/2019 1206   LABSPEC 1.020 03/04/2019 1206  PHURINE 7.5 03/04/2019 1206   GLUCOSEU NEGATIVE 03/04/2019 1206   HGBUR NEGATIVE 03/04/2019 [place] 03/04/2019 1206   [place] 03/04/2019 [place] 03/04/2019 1206   NITRITE NEGATIVE 03/04/2019 1206   LEUKOCYTESUR NEGATIVE 03/04/2019 1206    Radiological Exams on Admission: DG Abdomen Acute W/Chest  Result Date: 08/17/2021 CLINICAL DATA:  Cough and vomiting. EXAM: DG ABDOMEN ACUTE WITH 1 VIEW CHEST. Patient is rotated on frontal view. COMPARISON:  Chest x-ray 06/15/2016, CT chest 08/12/2021. FINDINGS: Increased cardiomegaly with some component likely due to AP portable technique and patient rotation. The heart and mediastinal contours are otherwise unchanged. Aortic calcification. No focal consolidation. Persistent coarsened interstitial markings with likely superimposed mild pulmonary edema. No pleural effusion. No pneumothorax. There is no evidence of dilated bowel loops or free intraperitoneal air. No radiopaque calculi or other significant radiographic abnormality is seen. No acute osseous abnormality. Severe degenerative changes of the shoulder. IMPRESSION: 1. Increased cardiomegaly with some component likely due to AP portable technique and patient rotation. Underlying pericardial effusion not excluded. 2. Mild pulmonary edema. 3. Nonobstructive bowel gas pattern. 4.  Aortic Atherosclerosis (ICD10-I70.0). Electronically Signed   By: Iven Finn M.D.   On: 08/17/2021 19:34    EKG:  Personally reviewed. Atrial fibrillation, rate 95, early R wave transition.  Rate is faster when compared to prior.  Assessment/Plan Principal Problem:   Acute on chronic diastolic CHF (congestive heart failure) (HCC) Active Problems:   Hyponatremia   Permanent atrial fibrillation (HCC)   CAD (coronary artery disease)   Chronic respiratory failure with hypoxia (HCC)   Swayzie Diana is a 87 y.o. female with medical history significant for chronic diastolic CHF with severe pulmonary hypertension and RV failure, chronic hypoxic respiratory failure on 2-2.5 L O2 via [place], permanent atrial fibrillation on Xarelto, CAD s/p CABG, history of CVA, chronic hyponatremia, restless leg syndrome who is admitted with hyponatremia and acute on chronic diastolic CHF.  Acute on Chronic diastolic CHF with severe pulmonary hypertension and RV failure: Patient recently admitted for hyponatremia at which time she was given IV fluids and has been holding Lasix since discharge.  She is having frequent clear sputum production.  CXR suggestive of pulmonary edema.  On exam, has crackles throughout the lungs and trace lower extremity edema.  Overall picture concerning for acute on chronic diastolic CHF. Last TTE 11/02/2020 showed EF 65-70%, severe pulmonary hypertension. -Stop IV fluids -Give IV Lasix 20 mg once tonight, monitor response for further diuresis -Check BNP -Update echocardiogram -Strict I/O's and daily weights -Continue Coreg 6.25 mg BID  Acute on chronic hyponatremia: Sodium 120 on admission compared to 126 on 11/19 and 125 on 11/21.  Holding IV fluids due to concern for acute on chronic CHF with pulmonary edema as above.  Will monitor response with Lasix. -Follow urinalysis, urine sodium, urine osmolality, serum osmolality  Chronic respiratory failure with hypoxia: SPO2 currently stable on home 2-3 L O2 via [place] although seems have some increased work of breathing related to her pulm edema.  Permanent  atrial fibrillation: Remains in atrial fibrillation with controlled rate on admission.  Continue Xarelto and Coreg.  Generalized weakness: Deconditioned related to poor oral intake and acute on chronic CHF.  Request PT/OT eval while in hospital.  CAD s/p CABG: Denies any chest pain.  Continue Coreg, Ranexa, rosuvastatin, Xarelto.  History of CVA: Without residual deficit.  Continue rosuvastatin and Xarelto.  Restless leg syndrome: Continue Requip.  Goals of care: Patient  confirms DNR status on admission.  Daughter is interested in obtaining home health assistance and focus on quality of life at home to limit frequent hospitalization.  Recommend palliative care consult while in hospital.  DVT prophylaxis: Xarelto Code Status: DNR, confirmed on admission Family Communication: Discussed with patient's daughter at bedside Disposition Plan: From home, dispo pending clinical progress Consults called: None Level of care: Telemetry Cardiac Admission status:  Status is: Observation  The patient remains OBS appropriate and will d/c before 2 midnights.   Zada Finders MD [facility]  If 7PM-7AM, please contact night-coverage [email or web address]  08/17/2021, 9:44 PM

## 2021-08-17 NOTE — ED Provider Notes (Signed)
[place] EMERGENCY DEPARTMENT Provider Note   CSN: [id number] Arrival date & time: 08/17/21  1632     History Chief Complaint  Patient presents with   Weakness    Melissa Cardenas is a 87 y.o. female.   Weakness Associated symptoms: vomiting    Patient presents to the ED for evaluation of generalized weakness, persistent nausea and vomiting, generalized malaise.  Symptoms have been ongoing for over a week.  Patient ended up going to [facility] on November 18 where she was admitted.  According to the medical records she was discharged on November 19.  She was diagnosed with epigastric pain of unknown etiology.  She was also diagnosed with dehydration.  Patient had a CT of the abdomen and pelvis with contrast on November 18.  It showed no evidence of any acute findings.  She had a chest x-ray also that did not show evidence of pneumonia.  Patient was treated with medications for pain and nausea.  Patient had outpatient follow-up for and she continued to have difficulties with nausea and vomiting.  She has had continued difficulty trying to eat and drink.  She called her doctor and was instructed come to the ED Past Medical History:  Diagnosis Date   A-fib ([place])    Anemia    Arthritis    Blood transfusion    CHF (congestive heart failure) ([place])    Heart attack ([place])    Hypertension    Stroke [facility])    Thyroid disease     Patient Active Problem List   Diagnosis Date Noted   Cerebrovascular accident (CVA) ([place])    Other hyperlipidemia    Essential hypertension    CVA (cerebral vascular accident) ([place]) 11/05/2016    Past Surgical History:  Procedure Laterality Date   BREAST BIOPSY     cabg  1990   CORONARY ARTERY BYPASS GRAFT       OB History   No obstetric history on file.     Family History  Problem Relation Age of Onset   Heart attack Son        2 sons passed from heart attack   Hyperlipidemia Daughter     Social History    Tobacco Use   Smoking status: Former    Types: Cigarettes    Start date: 1945    Quit date: 1977    Years since quitting: 45.9   Smokeless tobacco: Never   Tobacco comments:    12-14 cigarettes per day from age [age]  Vaping Use   Vaping Use: Never used  Substance Use Topics   Alcohol use: Not Currently    Comment: rare   Drug use: No    Home Medications Prior to Admission medications   Medication Sig Start Date End Date Taking? Authorizing Provider  albuterol (VENTOLIN HFA) 108 (90 Base) MCG/ACT inhaler Inhale 2 puffs into the lungs every 6 (six) hours as needed for wheezing or shortness of breath. 03/10/21  Yes [provider]  carvedilol (COREG) 12.5 MG tablet Take 6.25 mg by mouth 2 (two) times daily. Take 1/2 tablet (6.25 mg) BID 08/15/21  Yes [provider]  cholecalciferol (VITAMIN D3) 25 MCG (1000 UNIT) tablet Take 1,000 Units by mouth daily.   Yes [provider]  Cholecalciferol 100 MCG (4000 UT) CAPS Take 1 capsule by mouth daily.   Yes [provider]  DIPHENHYDRAMINE HCL, TOPICAL, 2 % SOLN Apply 1 application topically daily as needed (itching).   Yes  [provider]  FEROSUL 325 (65 Fe) MG tablet Take 325 mg by mouth 2 (two) times daily. 08/15/21  Yes [provider]  hydrocortisone 2.5 % cream Apply 1 application topically 2 (two) times daily as needed (itching). 05/31/21  Yes [provider]  Hyoscyamine Sulfate SL 0.125 MG SUBL Place 0.125 mg under the tongue as needed (for stomach).    Yes [provider]  Multiple Vitamin (MULTIVITAMIN WITH MINERALS) TABS tablet Take 1 tablet by mouth daily.   Yes [provider]  Multiple Vitamins-Minerals (PRESERVISION AREDS PO) Take 1 tablet by mouth daily.   Yes [provider]  nitroGLYCERIN (NITROSTAT) 0.4 MG SL tablet Place 0.4 mg under the tongue every 5 (five) minutes as needed for chest pain.   Yes [provider]   omeprazole (PRILOSEC) 20 MG capsule Take 20 mg by mouth daily. 07/14/21  Yes [provider]  ondansetron (ZOFRAN ODT) 4 MG disintegrating tablet Take 1 tablet (4 mg total) by mouth every 8 (eight) hours as needed for nausea. 08/27/19  Yes Long, Wonda Olds, MD  polyethylene glycol powder (GLYCOLAX/MIRALAX) powder Take 17 g by mouth daily as needed for moderate constipation.   Yes [provider]  ranolazine (RANEXA) 500 MG 12 hr tablet Take 500 mg by mouth 2 (two) times daily.     Yes [provider]  rOPINIRole (REQUIP) 0.5 MG tablet Take 1 mg by mouth at bedtime. 05/18/21  Yes [provider]  rosuvastatin (CRESTOR) 40 MG tablet Take 40 mg by mouth every evening.   Yes [provider]  traMADol (ULTRAM) 50 MG tablet Take 50 mg by mouth every 6 (six) hours as needed for moderate pain. 08/16/21  Yes [provider]  triamcinolone cream (KENALOG) 0.1 % Apply 1 application topically 2 (two) times daily as needed (itching). 04/08/21  Yes [provider]  XARELTO 15 MG TABS tablet Take 15 mg by mouth every evening. 07/29/21  Yes [provider]  ciprofloxacin (CIPRO) 500 MG tablet Take 1 tablet (500 mg total) by mouth 2 (two) times daily. Patient not taking: Reported on 11/05/2016 09/24/16   Tanna Furry, MD  clopidogrel (PLAVIX) 75 MG tablet Take 1 tablet (75 mg total) by mouth daily. Patient not taking: Reported on 08/17/2021 11/07/16   Ophelia Shoulder, MD  furosemide (LASIX) 40 MG tablet Take 40 mg by mouth 2 (two) times daily. 07/11/21   [provider]  phenazopyridine (PYRIDIUM) 200 MG tablet Take 1 tablet (200 mg total) by mouth 3 (three) times daily. Patient not taking: Reported on 11/05/2016 09/24/16   Tanna Furry, MD    Allergies    Atromid s [clofibrate], Avelox [moxifloxacin hydrochloride], Nitrofuran derivatives, Other, Penicillins, Sulfa antibiotics, Apixaban, and Tetracycline  Review of Systems   Review of Systems   Gastrointestinal:  Positive for vomiting.       Has noticed some blood-tinged colored emesis  Neurological:  Positive for weakness.  All other systems reviewed and are negative.  Physical Exam Updated Vital Signs BP 105/63   Pulse 77   Temp 97.9 F (36.6 C)   Resp 19   Ht 1.422 m (4\' 8" )   Wt 47.6 kg   SpO2 98%   BMI 23.54 kg/m   Physical Exam Vitals and nursing note reviewed.  Constitutional:      Appearance: She is well-developed. She is not toxic-appearing or diaphoretic.  HENT:     Head: Normocephalic and atraumatic.     Right Ear: External  ear normal.     Left Ear: External ear normal.  Eyes:     General: No scleral icterus.       Right eye: No discharge.        Left eye: No discharge.     Conjunctiva/sclera: Conjunctivae normal.  Neck:     Trachea: No tracheal deviation.  Cardiovascular:     Rate and Rhythm: Normal rate and regular rhythm.  Pulmonary:     Effort: Pulmonary effort is normal. No respiratory distress.     Breath sounds: Normal breath sounds. No stridor. No wheezing or rales.  Abdominal:     General: Bowel sounds are normal. There is no distension.     Palpations: Abdomen is soft.     Tenderness: There is no abdominal tenderness. There is no guarding or rebound.  Musculoskeletal:        General: No tenderness or deformity.     Cervical back: Neck supple.  Skin:    General: Skin is warm and dry.     Findings: No rash.  Neurological:     General: No focal deficit present.     Mental Status: She is alert.     Cranial Nerves: No cranial nerve deficit (no facial droop, extraocular movements intact, no slurred speech).     Sensory: No sensory deficit.     Motor: No abnormal muscle tone or seizure activity.     Coordination: Coordination normal.  Psychiatric:        Mood and Affect: Mood normal.    ED Results / Procedures / Treatments   Labs (all labs ordered are listed, but only abnormal results are displayed) Labs Reviewed  COMPREHENSIVE  METABOLIC PANEL - Abnormal; Notable for the following components:      Result Value   Sodium 120 (*)    Chloride 81 (*)    CO2 33 (*)    Glucose, Bld 104 (*)    Albumin 3.4 (*)    All other components within normal limits  CBC WITH DIFFERENTIAL/PLATELET - Abnormal; Notable for the following components:   RBC 3.40 (*)    Hemoglobin 10.1 (*)    HCT 31.5 (*)    RDW 16.1 (*)    Lymphs Abs 0.4 (*)    All other components within normal limits  RESP PANEL BY RT-PCR (FLU A&B, COVID) [id number]  LIPASE, BLOOD  URINALYSIS, ROUTINE W REFLEX MICROSCOPIC  POC OCCULT BLOOD, ED    EKG EKG Interpretation  Date/Time:  Wednesday August 17 2021 16:39:14 EST Ventricular Rate:  95 PR Interval:    QRS Duration: 90 QT Interval:  349 QTC Calculation: 439 R Axis:   -13 Text Interpretation: Atrial fibrillation Abnormal R-wave progression, early transition Borderline repolarization abnormality [id number] Confirmed by Dorie Rank [id number]) on 08/17/2021 7:42:44 PM  Radiology DG Abdomen Acute W/Chest  Result Date: 08/17/2021 CLINICAL DATA:  Cough and vomiting. EXAM: DG ABDOMEN ACUTE WITH 1 VIEW CHEST. Patient is rotated on frontal view. COMPARISON:  Chest x-ray 06/15/2016, CT chest 08/12/2021. FINDINGS: Increased cardiomegaly with some component likely due to AP portable technique and patient rotation. The heart and mediastinal contours are otherwise unchanged. Aortic calcification. No focal consolidation. Persistent coarsened interstitial markings with likely superimposed mild pulmonary edema. No pleural effusion. No pneumothorax. There is no evidence of dilated bowel loops or free intraperitoneal air. No radiopaque calculi or other significant radiographic abnormality is seen. No acute osseous abnormality. Severe degenerative changes of the shoulder. IMPRESSION: 1. Increased cardiomegaly with some component likely due  to AP portable technique and patient rotation. Underlying pericardial effusion not excluded. 2.  Mild pulmonary edema. 3. Nonobstructive bowel gas pattern. 4.  Aortic Atherosclerosis (ICD10-I70.0). Electronically Signed   By: Tish Frederickson M.D.   On: 08/17/2021 19:34    Procedures Procedures   Medications Ordered in ED Medications  sodium chloride 0.9 % bolus 500 mL (0 mLs Intravenous Stopped 08/17/21 1948)    And  0.9 %  sodium chloride infusion ( Intravenous Restarted 08/17/21 1842)  ondansetron (ZOFRAN) injection 4 mg (4 mg Intravenous Given 08/17/21 1731)    ED Course  I have reviewed the triage vital signs and the nursing notes.  Pertinent labs & imaging results that were available during my care of the patient were reviewed by me and considered in my medical decision making (see chart for details).  Clinical Course as of 08/17/21 2003  Wed Aug 17, 2021  1911 CBC does show a decrease in her hemoglobin [JK]  1911 COVID and flu are negative [JK]  1911 Metabolic panel does show hyponatremia.  Previous labs show hyponatremia but this is worsening [JK]  1942 Acute bowel series does not show obstruction.  Cardiomegaly noted [JK]    Clinical Course User Index [JK] Linwood Dibbles, MD   MDM Rules/Calculators/A&P                           Patient presented to the ER for evaluation of increasing weakness after recent hospitalization.  She has had nausea and vomiting and decreased p.o. intake.  Acute abdominal series does not show evidence of obstruction.  She recently had a CT scan at another institution.  Patient does have slight anemia.  She has noticed some blood in her emesis Hemoccult was performed.  It is negative for acute blood.  Her labs however do show hyponatremia and hypochloremia.  This is worsening compared to previous values.  I suspect her weakness is multifactorial but she is not tolerating p.o. and her hyponatremia is worsening.  Mental status is stable.  Do not feel that hypertonic saline indicated at this point.  I will consult with medical service for admission and  further treatment. Final Clinical Impression(s) / ED Diagnoses Final diagnoses:  Hyponatremia  Weakness  Anemia, unspecified type    Rx / DC Orders ED Discharge Orders     None        Linwood Dibbles, MD 08/17/21 2005

## 2021-08-18 ENCOUNTER — Observation Stay (HOSPITAL_COMMUNITY): Payer: Medicare Other

## 2021-08-18 DIAGNOSIS — J9611 Chronic respiratory failure with hypoxia: Secondary | ICD-10-CM

## 2021-08-18 DIAGNOSIS — I5043 Acute on chronic combined systolic (congestive) and diastolic (congestive) heart failure: Secondary | ICD-10-CM | POA: Diagnosis present

## 2021-08-18 DIAGNOSIS — Z87891 Personal history of nicotine dependence: Secondary | ICD-10-CM | POA: Diagnosis not present

## 2021-08-18 DIAGNOSIS — Z882 Allergy status to sulfonamides status: Secondary | ICD-10-CM | POA: Diagnosis not present

## 2021-08-18 DIAGNOSIS — Z888 Allergy status to other drugs, medicaments and biological substances status: Secondary | ICD-10-CM | POA: Diagnosis not present

## 2021-08-18 DIAGNOSIS — E876 Hypokalemia: Secondary | ICD-10-CM | POA: Diagnosis present

## 2021-08-18 DIAGNOSIS — I252 Old myocardial infarction: Secondary | ICD-10-CM | POA: Diagnosis not present

## 2021-08-18 DIAGNOSIS — G2581 Restless legs syndrome: Secondary | ICD-10-CM | POA: Diagnosis present

## 2021-08-18 DIAGNOSIS — I110 Hypertensive heart disease with heart failure: Secondary | ICD-10-CM | POA: Diagnosis present

## 2021-08-18 DIAGNOSIS — R531 Weakness: Secondary | ICD-10-CM

## 2021-08-18 DIAGNOSIS — Z515 Encounter for palliative care: Secondary | ICD-10-CM | POA: Diagnosis not present

## 2021-08-18 DIAGNOSIS — Z881 Allergy status to other antibiotic agents status: Secondary | ICD-10-CM | POA: Diagnosis not present

## 2021-08-18 DIAGNOSIS — I4821 Permanent atrial fibrillation: Secondary | ICD-10-CM | POA: Diagnosis present

## 2021-08-18 DIAGNOSIS — Z66 Do not resuscitate: Secondary | ICD-10-CM

## 2021-08-18 DIAGNOSIS — I272 Pulmonary hypertension, unspecified: Secondary | ICD-10-CM | POA: Diagnosis present

## 2021-08-18 DIAGNOSIS — Z951 Presence of aortocoronary bypass graft: Secondary | ICD-10-CM | POA: Diagnosis not present

## 2021-08-18 DIAGNOSIS — Z20822 Contact with and (suspected) exposure to covid-19: Secondary | ICD-10-CM | POA: Diagnosis present

## 2021-08-18 DIAGNOSIS — D649 Anemia, unspecified: Secondary | ICD-10-CM | POA: Diagnosis present

## 2021-08-18 DIAGNOSIS — Z880 Allergy status to penicillin: Secondary | ICD-10-CM | POA: Diagnosis not present

## 2021-08-18 DIAGNOSIS — I5033 Acute on chronic diastolic (congestive) heart failure: Secondary | ICD-10-CM

## 2021-08-18 DIAGNOSIS — E079 Disorder of thyroid, unspecified: Secondary | ICD-10-CM | POA: Diagnosis present

## 2021-08-18 DIAGNOSIS — Z7189 Other specified counseling: Secondary | ICD-10-CM

## 2021-08-18 DIAGNOSIS — Z8673 Personal history of transient ischemic attack (TIA), and cerebral infarction without residual deficits: Secondary | ICD-10-CM | POA: Diagnosis not present

## 2021-08-18 DIAGNOSIS — I2510 Atherosclerotic heart disease of native coronary artery without angina pectoris: Secondary | ICD-10-CM | POA: Diagnosis present

## 2021-08-18 DIAGNOSIS — Z79899 Other long term (current) drug therapy: Secondary | ICD-10-CM | POA: Diagnosis not present

## 2021-08-18 DIAGNOSIS — E871 Hypo-osmolality and hyponatremia: Secondary | ICD-10-CM | POA: Diagnosis present

## 2021-08-18 DIAGNOSIS — E873 Alkalosis: Secondary | ICD-10-CM | POA: Diagnosis present

## 2021-08-18 LAB — CBC
HCT: 31.7 % — ABNORMAL LOW (ref 36.0–46.0)
Hemoglobin: 10.2 g/dL — ABNORMAL LOW (ref 12.0–15.0)
MCH: 29.5 pg (ref 26.0–34.0)
MCHC: 32.2 g/dL (ref 30.0–36.0)
MCV: 91.6 fL (ref 80.0–100.0)
Platelets: 172 10*3/uL (ref 150–400)
RBC: 3.46 MIL/uL — ABNORMAL LOW (ref 3.87–5.11)
RDW: 16.2 % — ABNORMAL HIGH (ref 11.5–15.5)
WBC: 9.2 10*3/uL (ref 4.0–10.5)
nRBC: 0 % (ref 0.0–0.2)

## 2021-08-18 LAB — ECHOCARDIOGRAM COMPLETE
AR max vel: 2.07 cm2
AV Area VTI: 1.91 cm2
AV Area mean vel: 1.81 cm2
AV Mean grad: 5 mmHg
AV Peak grad: 8.8 mmHg
Ao pk vel: 1.48 m/s
Area-P 1/2: 4.6 cm2
Height: 56 in
MV M vel: 4.43 m/s
MV Peak grad: 78.5 mmHg
Radius: 0.4 cm
S' Lateral: 1.4 cm
Weight: 1820.12 oz

## 2021-08-18 LAB — BASIC METABOLIC PANEL
Anion gap: 7 (ref 5–15)
BUN: 15 mg/dL (ref 8–23)
CO2: 33 mmol/L — ABNORMAL HIGH (ref 22–32)
Calcium: 8.9 mg/dL (ref 8.9–10.3)
Chloride: 83 mmol/L — ABNORMAL LOW (ref 98–111)
Creatinine, Ser: 0.86 mg/dL (ref 0.44–1.00)
GFR, Estimated: >60 mL/min (ref >60)
Glucose, Bld: 111 mg/dL — ABNORMAL HIGH (ref 70–99)
Potassium: 3.9 mmol/L (ref 3.5–5.1)
Sodium: 123 mmol/L — ABNORMAL LOW (ref 135–145)

## 2021-08-18 LAB — SODIUM
Sodium: 125 mmol/L — ABNORMAL LOW (ref 135–145)
Sodium: 126 mmol/L — ABNORMAL LOW (ref 135–145)

## 2021-08-18 LAB — GLUCOSE, CAPILLARY: Glucose-Capillary: 91 mg/dL (ref 70–99)

## 2021-08-18 LAB — MAGNESIUM: Magnesium: 2.2 mg/dL (ref 1.7–2.4)

## 2021-08-18 MED ORDER — LORAZEPAM 0.5 MG PO TABS
0.5000 mg | ORAL_TABLET | Freq: Once | ORAL | Status: AC
  Administered 2021-08-18: 0.5 mg via ORAL
  Filled 2021-08-18: qty 1

## 2021-08-18 MED ORDER — MORPHINE SULFATE (CONCENTRATE) 10 MG/0.5ML PO SOLN
5.0000 mg | ORAL | Status: DC | PRN
  Administered 2021-08-20 (×3): 5 mg via ORAL
  Filled 2021-08-18 (×3): qty 0.5

## 2021-08-18 MED ORDER — FERROUS SULFATE 325 (65 FE) MG PO TABS
325.0000 mg | ORAL_TABLET | Freq: Two times a day (BID) | ORAL | Status: DC
  Administered 2021-08-18 – 2021-08-20 (×5): 325 mg via ORAL
  Filled 2021-08-18 (×5): qty 1

## 2021-08-18 MED ORDER — NITROGLYCERIN 0.4 MG SL SUBL: 0.4000 mg | SUBLINGUAL_TABLET | SUBLINGUAL | Status: DC | PRN

## 2021-08-18 MED ORDER — POLYETHYLENE GLYCOL 3350 17 G PO PACK: 17.0000 g | PACK | Freq: Every day | ORAL | Status: DC | PRN

## 2021-08-18 MED ORDER — FUROSEMIDE 10 MG/ML IJ SOLN
40.0000 mg | Freq: Two times a day (BID) | INTRAMUSCULAR | Status: DC
  Administered 2021-08-18 – 2021-08-19 (×3): 40 mg via INTRAVENOUS
  Filled 2021-08-18 (×3): qty 4

## 2021-08-18 MED ORDER — PROCHLORPERAZINE EDISYLATE 10 MG/2ML IJ SOLN
5.0000 mg | Freq: Once | INTRAMUSCULAR | Status: AC
  Administered 2021-08-18: 5 mg via INTRAVENOUS
  Filled 2021-08-18: qty 1

## 2021-08-18 MED ORDER — ROPINIROLE HCL 0.5 MG PO TABS
0.5000 mg | ORAL_TABLET | Freq: Once | ORAL | Status: AC
  Administered 2021-08-18: 0.5 mg via ORAL
  Filled 2021-08-18: qty 1

## 2021-08-18 MED ORDER — LORAZEPAM 0.5 MG PO TABS
0.5000 mg | ORAL_TABLET | Freq: Three times a day (TID) | ORAL | Status: DC | PRN
  Administered 2021-08-19: 0.5 mg via ORAL
  Filled 2021-08-18: qty 1

## 2021-08-18 NOTE — Plan of Care (Signed)
°  Problem: Education: °Goal: Ability to demonstrate management of disease process will improve °Outcome: Progressing °  °Problem: Education: °Goal: Ability to verbalize understanding of medication therapies will improve °Outcome: Progressing °  °Problem: Education: °Goal: Individualized Educational Video(s) °Outcome: Progressing °  °Problem: Activity: °Goal: Capacity to carry out activities will improve °Outcome: Progressing °  °

## 2021-08-18 NOTE — Progress Notes (Signed)
MD, pt has a gold DNR paper for u to sign in front of the paper chart, thanks Lavonda Jumbo

## 2021-08-18 NOTE — Progress Notes (Signed)
[facility] night shift telemetry coverage note.  The nursing staff reported that the patient was still having nausea/emesis despite receiving Zofran earlier.  Prochlorperazine 5 mg IVP x1 dose ordered.  Sanda Klein, MD.

## 2021-08-18 NOTE — Progress Notes (Signed)
  Echocardiogram 2D Echocardiogram has been performed.  Melissa Cardenas 08/18/2021, 8:44 AM

## 2021-08-18 NOTE — Progress Notes (Signed)
PROGRESS NOTE    Melissa Cardenas  WUJ:[id number] DOB: 1924-09-01 DOA: 08/17/2021 PCP: Kristopher Glee., MD   Brief Narrative:  HPI: Melissa Cardenas is a 87 y.o. female with medical history significant for chronic diastolic CHF with severe pulmonary hypertension and RV failure, chronic hypoxic respiratory failure on 2-2.5 L O2 via [place], permanent atrial fibrillation on Xarelto, CAD s/p CABG, history of CVA, chronic hyponatremia, restless leg syndrome who presented to the ED for evaluation of generalized weakness.  Patient recently admitted at [facility] 08/12/2021-08/13/2021.  She initially presented for evaluation of abdominal pain.  CT abdomen/pelvis with contrast on 11/18 was negative for acute abdominal pelvic findings.  Extensive colonic diverticulosis without diverticulitis, small right pleural effusion, cardiomegaly with marked dilatation of right atrium, and hepatic steatosis noted.   She was found to be hyponatremic with sodium 121.  Her home Lasix was held and she was treated with IV fluids overnight.  Repeat sodium was 126 and patient was discharged to home with hospitalist at home follow-up.   Patient was seen by telehospitalist on 11/21.  Repeat BMP was obtained and showed sodium 125.  Patient was advised to continue to hold diuretics.  She was scheduled for follow-up visit on 11/25.  Patient states that she was feeling okay for 1 day after she left the hospital however she has otherwise been feeling progressively weaker and fatigued.  She has had low appetite and not eating much.  She has been experiencing nausea without emesis.  She has had frequent cough productive of clear sputum, sometimes seeing any pink or red spots mixed in.  She reports having decreased urine output recently which appears dark.  She denies any loose stools or diarrhea.  She denies any chest pain.  She states she does not have much shortness of breath when resting but does appear to have increased work of breathing  compared to her baseline per daughter.   ED Course:  Initial vitals show BP 127/71, pulse 84, RR 21, temp 97.9 F, SPO2 100% on 3 L supplemental O2 via [place].   Labs show sodium 120, potassium 4.4, chloride 81, bicarb 33, BUN 14, creatinine 0.80, serum glucose 104, AST 34, ALT 22, alk phos 71, total bilirubin 1.1, lipase 21, WBC 6.6, hemoglobin 10.1, platelets 160,000.   COVID and influenza PCR negative.   1 view chest x-ray showed increased cardiomegaly, mild pulmonary edema, possible underlying pericardial effusion although exam limited due to AP portable technique and patient rotation.   Abdominal x-ray shows nonobstructive bowel gas pattern.   Patient was given 500 cc normal saline followed by 100 mL/hour continuous fluids.   The hospitalist service was consulted to admit for further evaluation and management.    Assessment & Plan:   Principal Problem:   Acute on chronic diastolic CHF (congestive heart failure) (HCC) Active Problems:   Hyponatremia   Permanent atrial fibrillation (HCC)   CAD (coronary artery disease)   Chronic respiratory failure with hypoxia (HCC)  Acute on Chronic diastolic CHF with severe pulmonary hypertension and RV failure: Patient recently admitted for hyponatremia at which time she was given IV fluids and has been holding Lasix since discharge. CXR suggestive of pulmonary edema.  Last TTE 11/02/2020 showed EF 65-70%, severe pulmonary hypertension.  She received 1 dose of Lasix 20 mg IV in the ED.  Still appears to be having some dyspnea.  Per daughter who is at the bedside, patient looks slightly worse than yesterday.  I will start her on Lasix IV 40  mg twice daily, with a strict I's and O's, low-sodium diet and daily weight.  Echo pending.  Continue Coreg.  Acute on chronic hyponatremia: Sodium 120 on admission compared to 126 on 11/19 and 125 on 11/21.  This is likely secondary to CHF exacerbation.  Sodium improved to 123.  Check every 8 hours and treat  underlying cause/CHF.   Chronic respiratory failure with hypoxia: SPO2 currently stable on home 2-3 L O2 via [place] although seems have some increased work of breathing related to her pulm edema.   Permanent atrial fibrillation: Remains in atrial fibrillation with controlled rate on admission.  Continue Xarelto and Coreg.   Generalized weakness: Deconditioned related to poor oral intake and acute on chronic CHF.  PT OT consulted.   CAD s/p CABG: Denies any chest pain.  Continue Coreg, Ranexa, rosuvastatin, Xarelto.   History of CVA: Without residual deficit.  Continue rosuvastatin and Xarelto.   Restless leg syndrome: Continue Requip.   Goals of care: Family interested in palliative care.  They are consulted.  DVT prophylaxis:    Code Status: DNR  Family Communication:  None present at bedside.  Plan of care discussed with daughter and son over the phone.  Status is: Observation  The patient will require care spanning > 2 midnights and should be moved to inpatient because: Needs further IV diuresis for CHF exacerbation.   Estimated body mass index is 25.5 kg/m as calculated from the following:   Height as of this encounter: _0  (1.422 m).   Weight as of this encounter: 51.6 kg.  Nutritional Assessment: Body mass index is 25.5 kg/m.Marland Kitchen Seen by dietician.  I agree with the assessment and plan as outlined below: Nutrition Status:   Skin Assessment: I have examined the patient's skin and I agree with the wound assessment as performed by the wound care RN as outlined below:    Consultants:  None  Procedures:  None  Antimicrobials:  Anti-infectives (From admission, onward)    None          Subjective: Patient seen and examined.  Patient too tired to have conversation but she appears to be oriented.  Seems to be having some difficulty with breathing.  Objective: Vitals:   08/18/21 0013 08/18/21 0200 08/18/21 0515 08/18/21 0746  BP: (!) 100/45  125/72 126/63   Pulse: 65 87 82 92  Resp: _1 Temp: 98.2 F (36.8 C)  97.9 F (36.6 C)   TempSrc: Oral  Oral   SpO2: 91% 95% 99% 98%  Weight:   51.6 kg   Height:        Intake/Output Summary (Last 24 hours) at 08/18/2021 1021 Last data filed at 08/18/2021 0843 Gross per 24 hour  Intake 823.33 ml  Output 500 ml  Net 323.33 ml   Filed Weights   08/17/21 1644 08/17/21 2211 08/18/21 0515  Weight: 47.6 kg 50.6 kg 51.6 kg    Examination:  General exam: Appears slightly uncomfortable and having some dyspnea. Respiratory system: Diminished breath sounds with poor inspiratory effort. Cardiovascular system: S1 & S2 heard, irregularly irregular rate and rhythm. No JVD, murmurs, rubs, gallops or clicks. No pedal edema. Gastrointestinal system: Abdomen is nondistended, soft and nontender. No organomegaly or masses felt. Normal bowel sounds heard. Central nervous system: Lethargic and oriented.  No focal deficit. Extremities: Symmetric 5 x 5 power. Skin: No rashes, lesions or ulcers   Data Reviewed: I have personally reviewed following labs and imaging studies  CBC: Recent  Labs  Lab 08/17/21 1710 08/18/21 0230  WBC 6.6 9.2  NEUTROABS 5.7  --   HGB 10.1* 10.2*  HCT 31.5* 31.7*  MCV 92.6 91.6  PLT 160 932   Basic Metabolic Panel: Recent Labs  Lab 08/17/21 1710 08/18/21 0230  NA 120* 123*  K 4.4 3.9  CL 81* 83*  CO2 33* 33*  GLUCOSE 104* 111*  BUN 14 15  CREATININE 0.80 0.86  CALCIUM 9.1 8.9  MG  --  2.2   GFR: Estimated Creatinine Clearance: 26.2 mL/min (by C-G formula based on SCr of 0.86 mg/dL). Liver Function Tests: Recent Labs  Lab 08/17/21 1710  AST 34  ALT 22  ALKPHOS 71  BILITOT 1.1  PROT 6.8  ALBUMIN 3.4*   Recent Labs  Lab 08/17/21 1710  LIPASE 21   No results for input(s): AMMONIA in the last 168 hours. Coagulation Profile: No results for input(s): INR, PROTIME in the last 168 hours. Cardiac Enzymes: No results for input(s): CKTOTAL, CKMB,  CKMBINDEX, TROPONINI in the last 168 hours. BNP (last 3 results) No results for input(s): PROBNP in the last 8760 hours. HbA1C: No results for input(s): HGBA1C in the last 72 hours. CBG: Recent Labs  Lab 08/18/21 0317  GLUCAP 91   Lipid Profile: No results for input(s): CHOL, HDL, LDLCALC, TRIG, CHOLHDL, LDLDIRECT in the last 72 hours. Thyroid Function Tests: No results for input(s): TSH, T4TOTAL, FREET4, T3FREE, THYROIDAB in the last 72 hours. Anemia Panel: No results for input(s): VITAMINB12, FOLATE, FERRITIN, TIBC, IRON, RETICCTPCT in the last 72 hours. Sepsis Labs: No results for input(s): PROCALCITON, LATICACIDVEN in the last 168 hours.  Recent Results (from the past 240 hour(s))  Resp Panel by RT-PCR (Flu A&B, Covid) Nasopharyngeal Swab     Status: None   Collection Time: 08/17/21  4:56 PM   Specimen: Nasopharyngeal Swab; Nasopharyngeal(NP) swabs in vial transport medium  Result Value Ref Range Status   SARS Coronavirus 2 by RT PCR NEGATIVE NEGATIVE Final    Comment: (NOTE) SARS-CoV-2 target nucleic acids are NOT DETECTED.  The SARS-CoV-2 RNA is generally detectable in upper respiratory specimens during the acute phase of infection. The lowest concentration of SARS-CoV-2 viral copies this assay can detect is 138 copies/mL. A negative result does not preclude SARS-Cov-2 infection and should not be used as the sole basis for treatment or other patient management decisions. A negative result may occur with  improper specimen collection/handling, submission of specimen other than nasopharyngeal swab, presence of viral mutation(s) within the areas targeted by this assay, and inadequate number of viral copies(<138 copies/mL). A negative result must be combined with clinical observations, patient history, and epidemiological information. The expected result is Negative.  Fact Sheet for Patients:  EntrepreneurPulse.com.au  Fact Sheet for Healthcare  Providers:  IncredibleEmployment.be  This test is no t yet approved or cleared by the [place] FDA and  has been authorized for detection and/or diagnosis of SARS-CoV-2 by FDA under an Emergency Use Authorization (EUA). This EUA will remain  in effect (meaning this test can be used) for the duration of the COVID-19 declaration under Section 564(b)(1) of the Act, 21 U.S.C.section 360bbb-3(b)(1), unless the authorization is terminated  or revoked sooner.       Influenza A by PCR NEGATIVE NEGATIVE Final   Influenza B by PCR NEGATIVE NEGATIVE Final    Comment: (NOTE) The Xpert Xpress SARS-CoV-2/FLU/RSV plus assay is intended as an aid in the diagnosis of influenza from Nasopharyngeal swab specimens and should not  be used as a sole basis for treatment. Nasal washings and aspirates are unacceptable for Xpert Xpress SARS-CoV-2/FLU/RSV testing.  Fact Sheet for Patients: EntrepreneurPulse.com.au  Fact Sheet for Healthcare Providers: IncredibleEmployment.be  This test is not yet approved or cleared by the [place] FDA and has been authorized for detection and/or diagnosis of SARS-CoV-2 by FDA under an Emergency Use Authorization (EUA). This EUA will remain in effect (meaning this test can be used) for the duration of the COVID-19 declaration under Section 564(b)(1) of the Act, 21 U.S.C. section 360bbb-3(b)(1), unless the authorization is terminated or revoked.  Performed at [facility], [place] [address] [place], [place] [address]       Radiology Studies: DG Abdomen Acute W/Chest  Result Date: 08/17/2021 CLINICAL DATA:  Cough and vomiting. EXAM: DG ABDOMEN ACUTE WITH 1 VIEW CHEST. Patient is rotated on frontal view. COMPARISON:  Chest x-ray 06/15/2016, CT chest 08/12/2021. FINDINGS: Increased cardiomegaly with some component likely due to AP portable technique and patient rotation. The heart and mediastinal  contours are otherwise unchanged. Aortic calcification. No focal consolidation. Persistent coarsened interstitial markings with likely superimposed mild pulmonary edema. No pleural effusion. No pneumothorax. There is no evidence of dilated bowel loops or free intraperitoneal air. No radiopaque calculi or other significant radiographic abnormality is seen. No acute osseous abnormality. Severe degenerative changes of the shoulder. IMPRESSION: 1. Increased cardiomegaly with some component likely due to AP portable technique and patient rotation. Underlying pericardial effusion not excluded. 2. Mild pulmonary edema. 3. Nonobstructive bowel gas pattern. 4.  Aortic Atherosclerosis (ICD10-I70.0). Electronically Signed   By: Iven Finn M.D.   On: 08/17/2021 19:34    Scheduled Meds:  carvedilol  6.25 mg Oral BID AC   ferrous sulfate  325 mg Oral BID   furosemide  40 mg Intravenous BID   pantoprazole  40 mg Oral Daily   ranolazine  500 mg Oral BID   Rivaroxaban  15 mg Oral QPM   rOPINIRole  1 mg Oral QHS   rosuvastatin  40 mg Oral QPM   sodium chloride flush  3 mL Intravenous Q12H   Continuous Infusions:   LOS: 0 days   Time spent: 35 minutes   Darliss Cheney, MD [facility]  08/18/2021, 10:21 AM  Please page via Shea Evans and do not message via secure chat for anything urgent. Secure chat can be used for anything non urgent.  How to contact the [facility] Attending or Consulting provider [place] or covering provider during after hours [place], for this patient?  Check the care team in [facility] and look for a) attending/consulting TRH provider listed and b) the [facility] team listed. Page or secure chat [phone]. Log into [email or web address] and use [place]'s universal password to access. If you do not have the password, please contact the hospital operator. Locate the [facility] provider you are looking for under [facility] and page to a number that you can be directly reached. If you still have difficulty  reaching the provider, please page the [facility] (Director on Call) for the Hospitalists listed on amion for assistance.

## 2021-08-18 NOTE — Care Management Obs Status (Signed)
MEDICARE OBSERVATION STATUS NOTIFICATION   Patient Details  Name: Melissa Cardenas MRN: [id number] Date of Birth: [birth year]   Medicare Observation Status Notification Given:  Yes    Tom-Johnson, Hershal Coria, RN 08/18/2021, 12:32 PM

## 2021-08-18 NOTE — Consult Note (Addendum)
Consultation Note Date: 08/18/2021   Patient Name: Melissa Cardenas  DOB: 02/19/1924  MRN: [id number]  Age / Sex: 87 y.o., female  PCP: Melissa Cardenas., MD Referring Physician: Darliss Cheney, MD  Reason for Consultation: Establishing goals of care  HPI/Patient Profile: 87 y.o. female  with past medical history of chronic systolic CHF with severe pulmonary hypertension and RV failure, chronic hypoxic respiratory failure on 2-2.5 L O2 via [place], permanent atrial fibrillation on Xarelto, CAD s/p CABG, history of CVA, chronic hyponatremia, and restless leg syndrome who presented to the emergency department on 08/17/2021 for evaluation of generalized weakness.  Patient was recently admitted at [facility] 08/12/21 - 08/13/21 and found to have extensive colonic diverticulosis without diverticulitis, small right pleural effusion, and cardiomegaly with marker dilatation of right atrium.  In the ED, chest x-ray showed increased cardiomegaly, mild pulmonary edema, and possible underlying pericardial effusion. She was admitted to [facility] with acute on chronic diastolic CHF.   Clinical Assessment and Goals of Care: I have reviewed medical records including EPIC notes, labs and imaging, and met at bedside with patient and her daughter Melissa Cardenas to discuss diagnosis, prognosis, GOC, EOL wishes, disposition, and options. Patient reports feeling very weak. Patient is orented and will answer questions as needed, but is not fully able to participate in [place] discussion due to weakness.   Melissa Cardenas is able to have her brother Melissa Cardenas present by video call. Melissa Cardenas shares that he is patient's HCPOA, but that he and Melissa Cardenas make decisions together. I introduced Palliative Medicine as specialized medical care for people living with serious illness. It focuses on providing relief from the symptoms and stress of a serious illness.   We discussed a brief  life review of the patient. She was born in [place], but has lived in the [place] for over 51 years. She has 2 children, Melissa Cardenas and Melissa Cardenas. 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 She has been widowed for at least 10 years. She is of the [employer].    Patient has lived with Melissa Cardenas for the past four years. As far as over functional status, there has been a decline over the past year, and more significantly over the past 6 months. Patient's mobility has become significantly limited due to fatigue and shortness of breath. She has needed increased assistance with ADLs.   We discussed her current illness and what it means in the larger context of her ongoing co-morbidities.  Natural disease trajectory of heart failure was discussed. Family verbalizes understanding that this is a progressive illness and states "we know where this is headed".   The difference between full scope medical intervention and comfort care was considered. Reviewed the concept of a comfort path to family, emphasizing that this path involves de-escalating and stopping full scope medical interventions, allowing a natural course to occur. Discussed that the goal is comfort and dignity rather than prolonging life. Family (and patient herself) endorse that the goal is comfort at this  point. They also want to avoid rehospitalization.   Provided education and counseling at length on the philosophy and benefits of hospice care. Discussed that it offers a holistic approach to care in the setting of end-stage illness/disease, and is about supporting the patient where they are while allowing the natural course to occur.  Discussed the hospice team includes RNs, physicians, social workers, and chaplains. They can provide personal care, symptom management, and help keep patient out of the hospital. Family is agreeable to hospice services at home and state their preference for [facility].   Questions and concerns were addressed.  The family was encouraged to call with questions or concerns.    Primary decision maker: Patient with support from son and daughter. Son Melissa Cardenas is documented HCPOA.     SUMMARY OF RECOMMENDATIONS   Continue current care Plan is to discharge home with hospice when patient is medically optimized Family states preference for [facility] - TOC order placed and hospice liaison notified Patient will need comfort meds (2-3 days supply) at discharge  Symptom Management (please send Rx at discharge):  Ativan 0.5 mg every 8 hours as needed for anxiety, sleep, or arm twitching/spasms Morphine concentrate solution 5 mg every 4 hours as needed for pain or dyspnea  Code Status/Advance Care Planning: DNR  Prognosis:  < 6 months  Discharge Planning: Home with Hospice      Primary Diagnoses: Present on Admission:  Hyponatremia  Permanent atrial fibrillation (HCC)  Acute on chronic diastolic CHF (congestive heart failure) (HCC)  CAD (coronary artery disease)  Chronic respiratory failure with hypoxia ([place])   I have reviewed the medical record, interviewed the patient and family, and examined the patient. The following aspects are pertinent.  Past Medical History:  Diagnosis Date   A-fib ([place])    Anemia    Arthritis    Blood transfusion    CHF (congestive heart failure) ([place])    Heart attack ([place])    Hypertension    Stroke ([place])    Thyroid disease      Family History  Problem Relation Age of Onset   Heart attack Son        2 sons passed from heart attack   Hyperlipidemia Daughter    Scheduled Meds:  carvedilol  6.25 mg Oral BID AC   ferrous sulfate  325 mg Oral BID   furosemide  40 mg Intravenous BID   pantoprazole  40 mg Oral Daily   ranolazine  500 mg Oral BID   Rivaroxaban  15 mg Oral QPM   rOPINIRole  0.5 mg Oral Once   rOPINIRole  1 mg Oral QHS   rosuvastatin  40 mg Oral QPM   sodium chloride flush   3 mL Intravenous Q12H   Continuous Infusions: PRN Meds:.acetaminophen **OR** acetaminophen, albuterol, nitroGLYCERIN, ondansetron **OR** ondansetron (ZOFRAN) IV, polyethylene glycol, senna-docusate   Allergies  Allergen Reactions   Atromid S [Clofibrate]     unknown   Avelox [Moxifloxacin Hydrochloride]     whoosy and out of head   Nitrofuran Derivatives     unknown   Other     tetrosin allergy Unknown    Penicillins Hives and Itching   Sulfa Antibiotics Swelling   Apixaban Rash   Tetracycline Rash   Review of Systems  Neurological:  Positive for weakness.   Physical Exam Vitals reviewed.  Constitutional:      General: She is not in acute distress.    Appearance: She  is ill-appearing.  Pulmonary:     Effort: Pulmonary effort is normal.  Neurological:     Mental Status: She is oriented to person, place, and time. She is lethargic.     Motor: Weakness present.    Vital Signs: BP 103/65 (BP Location: Left Arm)   Pulse 68   Temp 97.9 F (36.6 C)   Resp 20   Ht 4' 8"  (1.422 m)   Wt 51.6 kg   SpO2 94%   BMI 25.50 kg/m  Pain Scale: 0-10   Pain Score: 0-No pain   SpO2: SpO2: 94 % O2 Device:SpO2: 94 % O2 Flow Rate: .O2 Flow Rate (L/min): 3 L/min  IO: Intake/output summary:  Intake/Output Summary (Last 24 hours) at 08/18/2021 1532 Last data filed at 08/18/2021 1348 Gross per 24 hour  Intake 888.33 ml  Output 1500 ml  Net -611.67 ml    Palliative Assessment/Data: PPS 20%     Time In: 1320 Time Out: 1430 Time Total: 70 minutes Greater than 50%  of this time was spent counseling and coordinating care related to the above assessment and plan.  Signed by: Lavena Bullion, NP   Please contact Palliative Medicine Team phone at [phone] for questions and concerns.  For individual provider: See Shea Evans

## 2021-08-19 DIAGNOSIS — I5033 Acute on chronic diastolic (congestive) heart failure: Secondary | ICD-10-CM | POA: Diagnosis not present

## 2021-08-19 LAB — BASIC METABOLIC PANEL
Anion gap: 8 (ref 5–15)
BUN: 12 mg/dL (ref 8–23)
CO2: 38 mmol/L — ABNORMAL HIGH (ref 22–32)
Calcium: 8.4 mg/dL — ABNORMAL LOW (ref 8.9–10.3)
Chloride: 79 mmol/L — ABNORMAL LOW (ref 98–111)
Creatinine, Ser: 0.92 mg/dL (ref 0.44–1.00)
GFR, Estimated: 57 mL/min — ABNORMAL LOW (ref >60)
Glucose, Bld: 107 mg/dL — ABNORMAL HIGH (ref 70–99)
Potassium: 2.5 mmol/L — CL (ref 3.5–5.1)
Sodium: 125 mmol/L — ABNORMAL LOW (ref 135–145)

## 2021-08-19 LAB — SODIUM
Sodium: 124 mmol/L — ABNORMAL LOW (ref 135–145)
Sodium: 124 mmol/L — ABNORMAL LOW (ref 135–145)

## 2021-08-19 LAB — MAGNESIUM: Magnesium: 2.2 mg/dL (ref 1.7–2.4)

## 2021-08-19 MED ORDER — POTASSIUM CHLORIDE CRYS ER 20 MEQ PO TBCR
40.0000 meq | EXTENDED_RELEASE_TABLET | ORAL | Status: AC
  Administered 2021-08-19 (×3): 40 meq via ORAL
  Filled 2021-08-19 (×3): qty 2

## 2021-08-19 MED ORDER — POTASSIUM CHLORIDE 10 MEQ/100ML IV SOLN
10.0000 meq | INTRAVENOUS | Status: AC
  Administered 2021-08-19 (×6): 10 meq via INTRAVENOUS
  Filled 2021-08-19 (×6): qty 100

## 2021-08-19 MED ORDER — SPIRONOLACTONE 25 MG PO TABS
50.0000 mg | ORAL_TABLET | Freq: Every day | ORAL | Status: DC
  Administered 2021-08-19 – 2021-08-20 (×2): 50 mg via ORAL
  Filled 2021-08-19 (×2): qty 2

## 2021-08-19 NOTE — Evaluation (Addendum)
Physical Therapy Evaluation Patient Details Name: Melissa Cardenas MRN: [id number] DOB: Jul 28, 1924 Today's Date: 08/19/2021  History of Present Illness  Pt is a 87 y.o. female who presented 08/17/21 with generalized weakness. Pt admitted with hyponatremia and acute on chronic diastolic CHF. PMH: chronic diastolic CHF with severe pulmonary hypertension and RV failure, chronic hypoxic respiratory failure on 2-2.5 L O2 via [place], permanent atrial fibrillation on Xarelto, CAD s/p CABG, history of CVA, chronic hyponatremia, restless leg syndrome   Clinical Impression  Per palliative note, plan is to go home with hospice and pursue pt comfort but continue with current care at this time. Pt presents with condition above and deficits mentioned below, see PT Problem List. PTA, she was living with her daughter, needing some physical assistance for bed mobility, transfers, and stairs and supervision for short household distance mobility using a rollator. Currently, pt displays deficits in strength, balance, and activity tolerance placing her at high risk for falls. Pt is requiring minA for all functional mobility and only able to tolerate ambulating up to ~5 ft with a RW at this time. Pt reports her goal is "to get stronger" and her family is in agreement to allow pt to decide whether to continue with therapy services or not. As her goal is to get stronger to improve her functional independence, will plan to continue with acute PT and follow-up with HHPT at this time.        Recommendations for follow up therapy are one component of a multi-disciplinary discharge planning process, led by the attending physician.  Recommendations may be updated based on patient status, additional functional criteria and insurance authorization.  Follow Up Recommendations Home health PT (but medicare will not pay for both Hospice and HHPT at same time, so likely will be unable to get HHPT)    Assistance Recommended at Discharge  Frequent or constant Supervision/Assistance  Functional Status Assessment Patient has had a recent decline in their functional status and demonstrates the ability to make significant improvements in function in a reasonable and predictable amount of time.  Equipment Recommendations  Hospital bed;Wheelchair (measurements PT);Wheelchair cushion (measurements PT) (planning to acquire these soon)    Recommendations for Other Services       Precautions / Restrictions Precautions Precautions: Fall Precaution Comments: 2.5 L O2 at baseline Restrictions Weight Bearing Restrictions: No      Mobility  Bed Mobility Overal bed mobility: Needs Assistance Bed Mobility: Supine to Sit     Supine to sit: Min assist;HOB elevated     General bed mobility comments: Pt requiring holding onto PT's hands to pull trunk up to sit and scoot to EOB with HOB elevated.    Transfers Overall transfer level: Needs assistance Equipment used: Rolling walker (2 wheels) Transfers: Sit to/from Stand;Bed to chair/wheelchair/BSC Sit to Stand: Min assist   Step pivot transfers: Min assist       General transfer comment: MinA to power up to stand from EOB and steady and then step to L with RW to transfer to recliner. Assistance and cues needed to lean anteriorly and to manage RW.    Ambulation/Gait Ambulation/Gait assistance: Min assist Gait Distance (Feet): 5 Feet Assistive device: Rolling walker (2 wheels) Gait Pattern/deviations: Step-through pattern;Decreased stride length;Shuffle;Trunk flexed;Leaning posteriorly Gait velocity: reduced Gait velocity interpretation: <1.31 ft/sec, indicative of household ambulator   General Gait Details: Pt with slightly posterior lean and slow, shuffling gait, needing minA to steady and manage RW with ambulating to chair.  Stairs  Wheelchair Mobility    Modified Rankin (Stroke Patients Only)       Balance Overall balance assessment: Needs  assistance Sitting-balance support: No upper extremity supported;Feet supported Sitting balance-Leahy Scale: Fair     Standing balance support: Reliant on assistive device for balance Standing balance-Leahy Scale: Poor Standing balance comment: Reliant on RW and minA.                             Pertinent Vitals/Pain Pain Assessment: Faces Faces Pain Scale: Hurts a little bit Pain Location: abdomen Pain Descriptors / Indicators: Discomfort Pain Intervention(s): Limited activity within patient's tolerance;Monitored during session;Repositioned    Home Living Family/patient expects to be discharged to:: Private residence Living Arrangements: Children (daughter) Available Help at Discharge: Family;Personal care attendant;Available 24 hours/day (getting a nurse from hospice, per pt that may be there 24/7?) Type of Home: House Home Access: Stairs to enter Entrance Stairs-Rails: Doctor, general practice of Steps: 3   Home Layout: One level Home Equipment: Rollator (4 wheels);BSC/3in1;Shower seat;Grab bars - tub/shower;Cane - single point;Insurance risk surveyor (2 wheels) (reports she is getting a w/c and hospital bed soon) Additional Comments: 2.5L O2 at baseline    Prior Function Prior Level of Function : Needs assist       Physical Assist : Mobility (physical);ADLs (physical) Mobility (physical): Bed mobility;Transfers;Stairs;Gait ADLs (physical): IADLs;Bathing Mobility Comments: Daughter assists in bed mobility, stairs, and transfers. Supervises pt with gait using rollator, short household distances. Reports several falls in past 6 months. ADLs Comments: Reports has needed her daughter to wash her back more recently, but otherwise was dressing and bathing herself mod I. Daughter cleans, cooks, and drives. Daughter manages pill box.     Hand Dominance   Dominant Hand: Right    Extremity/Trunk Assessment   Upper Extremity Assessment Upper  Extremity Assessment: Defer to OT evaluation    Lower Extremity Assessment Lower Extremity Assessment: Generalized weakness    Cervical / Trunk Assessment Cervical / Trunk Assessment: Kyphotic  Communication   Communication: No difficulties  Cognition Arousal/Alertness: Awake/alert Behavior During Therapy: WFL for tasks assessed/performed Overall Cognitive Status: Within Functional Limits for tasks assessed                                 General Comments: Able to recall PLOF and hx appropriately and follow commands appropriately.        General Comments General comments (skin integrity, edema, etc.): HR 90s; Called daughter who added pt's son to conversation about pt's d/c plan and desires as pt reported goal was "to get stronger" but wanted PT to discuss plans for further PT with her daughter. Daughter and son in agreement to allow pt to decide whether to continue with therapy or not and if her desire is to "get stronger" then plan is to proceed with therapy at this time.    Exercises     Assessment/Plan    PT Assessment Patient needs continued PT services  PT Problem List Decreased strength;Decreased activity tolerance;Decreased balance;Decreased mobility;Cardiopulmonary status limiting activity       PT Treatment Interventions DME instruction;Gait training;Stair training;Functional mobility training;Therapeutic activities;Therapeutic exercise;Balance training;Neuromuscular re-education;Patient/family education    PT Goals (Current goals can be found in the Care Plan section)  Acute Rehab PT Goals Patient Stated Goal: "to get stronger" PT Goal Formulation: With patient/family Time For Goal Achievement: 09/02/21 Potential to Achieve Goals: Fair  Frequency Min 3X/week   Barriers to discharge        Co-evaluation               AM-PAC PT "6 Clicks" Mobility  Outcome Measure Help needed turning from your back to your side while in a flat bed  without using bedrails?: A Little Help needed moving from lying on your back to sitting on the side of a flat bed without using bedrails?: A Little Help needed moving to and from a bed to a chair (including a wheelchair)?: A Little Help needed standing up from a chair using your arms (e.g., wheelchair or bedside chair)?: A Little Help needed to walk in hospital room?: A Lot Help needed climbing 3-5 steps with a railing? : A Lot 6 Click Score: 16    End of Session Equipment Utilized During Treatment: Gait belt;Oxygen Activity Tolerance: Patient limited by fatigue Patient left: in chair;with call bell/phone within reach;with chair alarm set;with nursing/sitter in room Nurse Communication: Mobility status PT Visit Diagnosis: Unsteadiness on feet (R26.81);Other abnormalities of gait and mobility (R26.89);Muscle weakness (generalized) (M62.81);History of falling (Z91.81);Difficulty in walking, not elsewhere classified (R26.2);Adult, failure to thrive (R62.7)    Time: 0916-1011 PT Time Calculation (min) (ACUTE ONLY): 55 min   Charges:   PT Evaluation $PT Eval Moderate Complexity: 1 Mod PT Treatments $Therapeutic Activity: 38-52 mins        Moishe Spice, PT, DPT Acute Rehabilitation Services  Pager: [phone] Office: [phone]   Orvan Falconer 08/19/2021, 11:18 AM

## 2021-08-19 NOTE — TOC Initial Note (Signed)
Transition of Care [facility]) - Initial/Assessment Note    Patient Details  Name: Melissa Cardenas MRN: [id number] Date of Birth: Jul 12, 1924  [facility]) CM/SW Contact:    Melissa Mosher Ms [facility] Supervisor Phone Number: [phone] 08/19/2021, 10:47 AM  Clinical Narrative:                 Talked to patient at the bedside, she requested that I talk to her son and daughter. Son Melissa Cardenas and daughter Melissa Cardenas called. Patient will be staying with daughter at discharge. Home Hospice choice offered, Melissa Cardenas and Melissa Cardenas chose [facility]. Referral made as requested. Talked to Melissa Cardenas with [facility], she is aware of the patient and is working on arranging DME needed for home - hospital bed, oxygen, wheelchair for possible delivery today. Patient will need ambulance transportation home at discharge. All questions answered. TOC will continue to follow for progression of care.  Expected Discharge Plan: Home w Hospice Care Barriers to Discharge: No Barriers Identified   Patient Goals and CMS Choice Patient states their goals for this hospitalization and ongoing recovery are:: to be free from pain CMS Medicare.gov Compare Post Acute Care list provided to:: Patient Represenative (must comment) Choice offered to / list presented to : [facility] POA / Guardian, Patient  Expected Discharge Plan and Services Expected Discharge Plan: Home w Hospice Care   Discharge Planning Services: CM Consult Post Acute Care Choice: Hospice Living arrangements for the past 2 months: Single Family Home                   DME Agency: [employer] Date DME Agency Contacted: 08/19/21 Time DME Agency Contacted: 1044 Representative spoke with at DME Agency: Melissa Cardenas to arrange DME through Hospice for patient- hosp bed, o2 wheelchair            Prior Living Arrangements/Services Living arrangements for the past 2 months: Single Family Home Lives with:: Adult  Children Patient language and need for interpreter reviewed:: No Do you feel safe going back to the place where you live?: Yes      Need for Family Participation in Patient Care: No (Comment) Care giver support system in place?: Yes (comment)   Criminal Activity/Legal Involvement Pertinent to Current Situation/Hospitalization: No - Comment as needed  Activities of Daily Living      Permission Sought/Granted Permission sought to share information with : Case Manager Permission granted to share information with : Yes, Verbal Permission Granted  Share Information with NAME: POA- Melissa Cardenas son and daughter Melissa Cardenas  Permission granted to share info w AGENCY: [facility]  Permission granted to share info w Relationship: son/ daughter  Permission granted to share info w Contact Information: [facility]  Emotional Assessment Appearance:: Appears older than stated age Attitude/Demeanor/Rapport: Gracious Affect (typically observed): Accepting Orientation: : Oriented to Self, Oriented to Place, Oriented to  Time, Oriented to Situation Alcohol / Substance Use: Not Applicable Psych Involvement: No (comment)  Admission diagnosis:  Hyponatremia [E87.1] Weakness [R53.1] Anemia, unspecified type [D64.9] Patient Active Problem List   Diagnosis Date Noted   Hyponatremia 08/17/2021   Permanent atrial fibrillation (HCC) 08/17/2021   Acute on chronic diastolic CHF (congestive heart failure) (HCC) 08/17/2021   CAD (coronary artery disease) 08/17/2021   Chronic respiratory failure with hypoxia (HCC) 08/17/2021   Cerebrovascular accident (CVA) (HCC)    Other hyperlipidemia    Essential hypertension    CVA (cerebral vascular accident) (HCC) 11/05/2016  PCP:  Melissa Cardenas., MD Pharmacy:   [facility] [phone] - [place], [place] - [address] AT [place] [address] [place] [address] Phone: [phone] Fax:  [phone]     Social Determinants of Health (SDOH) Interventions    Readmission Risk Interventions No flowsheet data found.

## 2021-08-19 NOTE — Progress Notes (Addendum)
PROGRESS NOTE    Melissa Cardenas  [id number] DOB: 1924/04/07 DOA: 08/17/2021 PCP: Kristopher Glee., MD   Brief Narrative:  Melissa Cardenas is a 87 y.o. female with medical history significant for chronic diastolic CHF with severe pulmonary hypertension and RV failure, chronic hypoxic respiratory failure on 2-2.5 L O2 via [place], permanent atrial fibrillation on Xarelto, CAD s/p CABG, history of CVA, chronic hyponatremia, restless leg syndrome who presented to the ED for evaluation of generalized weakness.  Patient recently admitted at [facility] 08/12/2021-08/13/2021.  She initially presented for evaluation of abdominal pain.  CT abdomen/pelvis with contrast on 11/18 was negative for acute abdominal pelvic findings.     She was found to be hyponatremic with sodium 121.  Her home Lasix was held and she was treated with IV fluids overnight.  Repeat sodium was 126 and patient was discharged to home with hospitalist at home follow-up.   Patient was seen by telehospitalist on 11/21.  Repeat BMP was obtained and showed sodium 125.  Patient was advised to continue to hold diuretics.  She was scheduled for follow-up visit on 11/25.  Patient states that she was feeling okay for 1 day after she left the hospital however she has otherwise been feeling progressively weaker and fatigued with low appetite, nausea without emesis.  She has had frequent cough productive of clear sputum.  Upon arrival to ED, she was hemodynamically stable.  Sodium was 120.  COVID and influenza negative.  Chest x-ray showed pulmonary edema.  Patient was admitted under [place].   Assessment & Plan:   Principal Problem:   Acute on chronic diastolic CHF (congestive heart failure) (HCC) Active Problems:   Hyponatremia   Permanent atrial fibrillation (HCC)   CAD (coronary artery disease)   Chronic respiratory failure with hypoxia (HCC)  Acute on Chronic diastolic CHF with severe pulmonary hypertension and RV failure: Patient recently  admitted for hyponatremia at which time she was given IV fluids and has been holding Lasix since discharge. CXR suggestive of pulmonary edema.  Last TTE 11/02/2020 showed EF 65-70%, severe pulmonary hypertension.  Feels and looks much better.  Had good diuresis of about 4 L in last 24 hours with net -2.6 L so far however patient's metabolic alkalosis is getting worse.  Unfortunately, cannot use Diamox due to sulfa allergy.  She already received dose of Lasix this morning, will hold further doses and repeat BMP tomorrow.  Will start on Aldactone 50 mg.  Repeat echo shows normal ejection fraction.  Acute on chronic hyponatremia: Sodium 120 on admission compared to 126 on 11/19 and 125 on 11/21.  This is likely secondary to CHF exacerbation.  Sodium improved and currently 125.  Continue to treat underlying cause.  Severe hypokalemia: Potassium 2.5.  Will replace.  Moderate to severe metabolic alkalosis: CO2 38, likely secondary to volume contraction due to Lasix.  Unfortunately, unable to use Diamox due to sulfa allergy.  Stopping Lasix and initiating on Aldactone.   Chronic respiratory failure with hypoxia: SPO2 currently stable on home 2-3 L O2 via [place] although seems have some increased work of breathing related to her pulm edema.   Permanent atrial fibrillation: Remains in atrial fibrillation with controlled rate on admission.  Continue Xarelto and Coreg.   Generalized weakness: Deconditioned related to poor oral intake and acute on chronic CHF.  PT OT consulted.   CAD s/p CABG: Denies any chest pain.  Continue Coreg, Ranexa, rosuvastatin, Xarelto.   History of CVA: Without residual deficit.  Continue rosuvastatin  and Xarelto.   Restless leg syndrome: Continue Requip.   Goals of care/placement: Palliative care discussed in length with the family and family has agreed to take the patient home with hospice.  I was informed by [facility] and palliative care that she will likely get all DME delivered  either today or tomorrow and hospice will see her tomorrow so plan is to discharge her tomorrow.  DVT prophylaxis:    Code Status: DNR  Family Communication:  None present at bedside.   Status is: Inpatient  Remains inpatient appropriate because: Needs IV diuresis.  Estimated body mass index is 24.12 kg/m as calculated from the following:   Height as of this encounter: 4\' 8"  (1.422 m).   Weight as of this encounter: 48.8 kg.  Nutritional Assessment: Body mass index is 24.12 kg/m.Marland Kitchen Seen by dietician.  I agree with the assessment and plan as outlined below: Nutrition Status:   Skin Assessment: I have examined the patient's skin and I agree with the wound assessment as performed by the wound care RN as outlined below:    Consultants:  None  Procedures:  None  Antimicrobials:  Anti-infectives (From admission, onward)    None          Subjective:  Patient seen and examined.  She is more talkative today compared to yesterday.  She has no complaints.  Objective: Vitals:   08/18/21 1936 08/19/21 0225 08/19/21 0615 08/19/21 1143  BP: (!) 104/50  131/70 112/65  Pulse: 82  84 88  Resp: 17  18 18   Temp: 98.2 F (36.8 C)  98.2 F (36.8 C) 98.2 F (36.8 C)  TempSrc:   Oral Oral  SpO2: 98%  97% 98%  Weight:  48.8 kg    Height:        Intake/Output Summary (Last 24 hours) at 08/19/2021 1150 Last data filed at 08/19/2021 1146 Gross per 24 hour  Intake 843 ml  Output 4650 ml  Net -3807 ml    Filed Weights   08/17/21 2211 08/18/21 0515 08/19/21 0225  Weight: 50.6 kg 51.6 kg 48.8 kg    Examination:  General exam: Appears calm and comfortable  Respiratory system: Faint crackles at bases bilaterally. Respiratory effort normal. Cardiovascular system: S1 & S2 heard, RRR. No JVD, murmurs, rubs, gallops or clicks. No pedal edema. Gastrointestinal system: Abdomen is nondistended, soft and nontender. No organomegaly or masses felt. Normal bowel sounds  heard. Central nervous system: Alert and oriented. No focal neurological deficits. Extremities: Symmetric 5 x 5 power. Skin: No rashes, lesions or ulcers.     Data Reviewed: I have personally reviewed following labs and imaging studies  CBC: Recent Labs  Lab 08/17/21 1710 08/18/21 0230  WBC 6.6 9.2  NEUTROABS 5.7  --   HGB 10.1* 10.2*  HCT 31.5* 31.7*  MCV 92.6 91.6  PLT 160 [id number]    Basic Metabolic Panel: Recent Labs  Lab 08/17/21 1710 08/18/21 0230 08/18/21 1216 08/18/21 2003 08/19/21 0342 08/19/21 0636  NA 120* 123* 125* 126* 125*  --   K 4.4 3.9  --   --  2.5*  --   CL 81* 83*  --   --  79*  --   CO2 33* 33*  --   --  38*  --   GLUCOSE 104* 111*  --   --  107*  --   BUN 14 15  --   --  12  --   CREATININE 0.80 0.86  --   --  0.92  --   CALCIUM 9.1 8.9  --   --  8.4*  --   MG  --  2.2  --   --   --  2.2    GFR: Estimated Creatinine Clearance: 23.8 mL/min (by C-G formula based on SCr of 0.92 mg/dL). Liver Function Tests: Recent Labs  Lab 08/17/21 1710  AST 34  ALT 22  ALKPHOS 71  BILITOT 1.1  PROT 6.8  ALBUMIN 3.4*    Recent Labs  Lab 08/17/21 1710  LIPASE 21    No results for input(s): AMMONIA in the last 168 hours. Coagulation Profile: No results for input(s): INR, PROTIME in the last 168 hours. Cardiac Enzymes: No results for input(s): CKTOTAL, CKMB, CKMBINDEX, TROPONINI in the last 168 hours. BNP (last 3 results) No results for input(s): PROBNP in the last 8760 hours. HbA1C: No results for input(s): HGBA1C in the last 72 hours. CBG: Recent Labs  Lab 08/18/21 0317  GLUCAP 91    Lipid Profile: No results for input(s): CHOL, HDL, LDLCALC, TRIG, CHOLHDL, LDLDIRECT in the last 72 hours. Thyroid Function Tests: No results for input(s): TSH, T4TOTAL, FREET4, T3FREE, THYROIDAB in the last 72 hours. Anemia Panel: No results for input(s): VITAMINB12, FOLATE, FERRITIN, TIBC, IRON, RETICCTPCT in the last 72 hours. Sepsis Labs: No results  for input(s): PROCALCITON, LATICACIDVEN in the last 168 hours.  Recent Results (from the past 240 hour(s))  Resp Panel by RT-PCR (Flu A&B, Covid) Nasopharyngeal Swab     Status: None   Collection Time: 08/17/21  4:56 PM   Specimen: Nasopharyngeal Swab; Nasopharyngeal(NP) swabs in vial transport medium  Result Value Ref Range Status   SARS Coronavirus 2 by RT PCR NEGATIVE NEGATIVE Final    Comment: (NOTE) SARS-CoV-2 target nucleic acids are NOT DETECTED.  The SARS-CoV-2 RNA is generally detectable in upper respiratory specimens during the acute phase of infection. The lowest concentration of SARS-CoV-2 viral copies this assay can detect is 138 copies/mL. A negative result does not preclude SARS-Cov-2 infection and should not be used as the sole basis for treatment or other patient management decisions. A negative result may occur with  improper specimen collection/handling, submission of specimen other than nasopharyngeal swab, presence of viral mutation(s) within the areas targeted by this assay, and inadequate number of viral copies(<138 copies/mL). A negative result must be combined with clinical observations, patient history, and epidemiological information. The expected result is Negative.  Fact Sheet for Patients:  BloggerCourse.com  Fact Sheet for Healthcare Providers:  SeriousBroker.it  This test is no t yet approved or cleared by the [place] FDA and  has been authorized for detection and/or diagnosis of SARS-CoV-2 by FDA under an Emergency Use Authorization (EUA). This EUA will remain  in effect (meaning this test can be used) for the duration of the COVID-19 declaration under Section 564(b)(1) of the Act, 21 U.S.C.section 360bbb-3(b)(1), unless the authorization is terminated  or revoked sooner.       Influenza A by PCR NEGATIVE NEGATIVE Final   Influenza B by PCR NEGATIVE NEGATIVE Final    Comment: (NOTE) The  Xpert Xpress SARS-CoV-2/FLU/RSV plus assay is intended as an aid in the diagnosis of influenza from Nasopharyngeal swab specimens and should not be used as a sole basis for treatment. Nasal washings and aspirates are unacceptable for Xpert Xpress SARS-CoV-2/FLU/RSV testing.  Fact Sheet for Patients: BloggerCourse.com  Fact Sheet for Healthcare Providers: SeriousBroker.it  This test is not yet approved or cleared by the [place] FDA  and has been authorized for detection and/or diagnosis of SARS-CoV-2 by FDA under an Emergency Use Authorization (EUA). This EUA will remain in effect (meaning this test can be used) for the duration of the COVID-19 declaration under Section 564(b)(1) of the Act, 21 U.S.C. section 360bbb-3(b)(1), unless the authorization is terminated or revoked.  Performed at [facility], [place] [address] [place], [place] [address]        Radiology Studies: DG Abdomen Acute W/Chest  Result Date: 08/17/2021 CLINICAL DATA:  Cough and vomiting. EXAM: DG ABDOMEN ACUTE WITH 1 VIEW CHEST. Patient is rotated on frontal view. COMPARISON:  Chest x-ray 06/15/2016, CT chest 08/12/2021. FINDINGS: Increased cardiomegaly with some component likely due to AP portable technique and patient rotation. The heart and mediastinal contours are otherwise unchanged. Aortic calcification. No focal consolidation. Persistent coarsened interstitial markings with likely superimposed mild pulmonary edema. No pleural effusion. No pneumothorax. There is no evidence of dilated bowel loops or free intraperitoneal air. No radiopaque calculi or other significant radiographic abnormality is seen. No acute osseous abnormality. Severe degenerative changes of the shoulder. IMPRESSION: 1. Increased cardiomegaly with some component likely due to AP portable technique and patient rotation. Underlying pericardial effusion not excluded. 2. Mild pulmonary  edema. 3. Nonobstructive bowel gas pattern. 4.  Aortic Atherosclerosis (ICD10-I70.0). Electronically Signed   By: Iven Finn M.D.   On: 08/17/2021 19:34   ECHOCARDIOGRAM COMPLETE  Result Date: 08/18/2021    ECHOCARDIOGRAM REPORT   Patient Name:   DOREE LONGMAN Date of Exam: 08/18/2021 Medical Rec #:  [id number]       Height:       56.0 in Accession #:    [id number]      Weight:       113.8 lb Date of Birth:  May 25, 1924       BSA:          1.397 m Patient Age:    89 years        BP:           16/63 mmHg Patient Gender: F               HR:           84 bpm. Exam Location:  Inpatient Procedure: 2D Echo, 3D Echo, Cardiac Doppler and Color Doppler Indications:    I50.40* Unspecified combined systolic (congestive) and diastolic                 (congestive) heart failure  History:        Patient has prior history of Echocardiogram examinations, most                 recent 11/06/2016. CHF, CAD, Abnormal ECG, Stroke,                 Arrythmias:Atrial Fibrillation, Signs/Symptoms:Shortness of                 Breath and Dyspnea; Risk Factors:Hypertension and Dyslipidemia.                 Hypoxia.  Sonographer:    Roseanna Rainbow RDCS Referring Phys: [id number] [place]  1. Left ventricular ejection fraction, by estimation, is 65 to 70%. The left ventricle has normal function. The left ventricle has no regional wall motion abnormalities. There is mild concentric left ventricular hypertrophy. Left ventricular diastolic function could not be evaluated. There is the interventricular septum is flattened in systole and diastole, consistent with right ventricular pressure  and volume overload.  2. Right ventricular systolic function is severely reduced. The right ventricular size is severely enlarged. There is moderately elevated pulmonary artery systolic pressure. The estimated right ventricular systolic pressure is [id number] mmHg.  3. Left atrial size was mildly dilated.  4. Right atrial size was severely dilated.  5.  The mitral valve is degenerative. Moderate mitral valve regurgitation.  6. The tricuspid valve is abnormal. Tricuspid valve regurgitation is severe.  7. The aortic valve is tricuspid. There is moderate calcification of the aortic valve. There is moderate thickening of the aortic valve. Aortic valve regurgitation is not visualized. Mild to moderate aortic valve stenosis.  8. The inferior vena cava is dilated in size with <50% respiratory variability, suggesting right atrial pressure of 15 mmHg. Comparison(s): Prior images unable to be directly viewed, comparison made by report only. Changes from prior study are noted. Prior study unable to be viewed. RV is now severely dilated with severely reduced function. Severe TR. RVSP ~55 mmHG. FINDINGS  Left Ventricle: Left ventricular ejection fraction, by estimation, is 65 to 70%. The left ventricle has normal function. The left ventricle has no regional wall motion abnormalities. The left ventricular internal cavity size was small. There is mild concentric left ventricular hypertrophy. The interventricular septum is flattened in systole and diastole, consistent with right ventricular pressure and volume overload. Left ventricular diastolic function could not be evaluated due to atrial fibrillation. Left ventricular diastolic function could not be evaluated. Right Ventricle: The right ventricular size is severely enlarged. No increase in right ventricular wall thickness. Right ventricular systolic function is severely reduced. There is moderately elevated pulmonary artery systolic pressure. The tricuspid regurgitant velocity is 3.17 m/s, and with an assumed right atrial pressure of 15 mmHg, the estimated right ventricular systolic pressure is [id number]. Left Atrium: Left atrial size was mildly dilated. Right Atrium: Right atrial size was severely dilated. Pericardium: There is no evidence of pericardial effusion. Mitral Valve: The mitral valve is degenerative in appearance.  Moderate mitral valve regurgitation. Tricuspid Valve: The tricuspid valve is abnormal. Tricuspid valve regurgitation is severe. No evidence of tricuspid stenosis. The flow in the hepatic veins is reversed during ventricular systole. Aortic Valve: The aortic valve is tricuspid. There is moderate calcification of the aortic valve. There is moderate thickening of the aortic valve. Aortic valve regurgitation is not visualized. Mild to moderate aortic stenosis is present. Aortic valve mean gradient measures 5.0 mmHg. Aortic valve peak gradient measures 8.8 mmHg. Aortic valve area, by VTI measures 1.91 cm. Pulmonic Valve: The pulmonic valve was grossly normal. Pulmonic valve regurgitation is mild to moderate. No evidence of pulmonic stenosis. Aorta: The aortic root and ascending aorta are structurally normal, with no evidence of dilitation. Venous: The inferior vena cava is dilated in size with less than 50% respiratory variability, suggesting right atrial pressure of 15 mmHg. IAS/Shunts: The atrial septum is grossly normal.  LEFT VENTRICLE PLAX 2D LVIDd:         3.20 cm LVIDs:         1.40 cm LV PW:         1.20 cm LV IVS:        1.50 cm LVOT diam:     1.80 cm   3D Volume EF: LV SV:         49        3D EF:        73 % LV SV Index:   35  LV EDV:       65 ml LVOT Area:     2.54 cm  LV ESV:       17 ml                          LV SV:        48 ml RIGHT VENTRICLE            IVC RV S prime:     4.81 cm/s  IVC diam: 2.50 cm TAPSE (M-mode): 0.7 cm LEFT ATRIUM             Index        RIGHT ATRIUM           Index LA diam:        4.10 cm 2.94 cm/m   RA Area:     41.60 cm LA Vol (A2C):   57.8 ml 41.38 ml/m  RA Volume:   179.00 ml 128.16 ml/m LA Vol (A4C):   52.0 ml 37.23 ml/m LA Biplane Vol: 59.4 ml 42.53 ml/m  AORTIC VALVE                     PULMONIC VALVE AV Area (Vmax):    2.07 cm      PR End Diast Vel: 1.81 msec AV Area (Vmean):   1.81 cm AV Area (VTI):     1.91 cm AV Vmax:           148.00 cm/s AV Vmean:           103.000 cm/s AV VTI:            0.257 m AV Peak Grad:      8.8 mmHg AV Mean Grad:      5.0 mmHg LVOT Vmax:         120.50 cm/s LVOT Vmean:        73.300 cm/s LVOT VTI:          0.192 m LVOT/AV VTI ratio: 0.75  AORTA Ao Root diam: 2.80 cm MITRAL VALVE                  TRICUSPID VALVE MV Area (PHT): 4.60 cm       TR Peak grad:   40.2 mmHg MV Decel Time: 165 msec       TR Vmax:        317.00 cm/s MR Peak grad:    78.5 mmHg MR Mean grad:    55.0 mmHg    SHUNTS MR Vmax:         443.00 cm/s  Systemic VTI:  0.19 m MR Vmean:        357.0 cm/s   Systemic Diam: 1.80 cm MR PISA:         1.01 cm MR PISA Eff ROA: 9 mm MR PISA Radius:  0.40 cm MV E velocity: 103.00 cm/s Eleonore Chiquito MD Electronically signed by Eleonore Chiquito MD Signature Date/Time: 08/18/2021/1:18:07 PM    Final     Scheduled Meds:  carvedilol  6.25 mg Oral BID AC   ferrous sulfate  325 mg Oral BID   furosemide  40 mg Intravenous BID   pantoprazole  40 mg Oral Daily   potassium chloride  40 mEq Oral Q4H   ranolazine  500 mg Oral BID   Rivaroxaban  15 mg Oral QPM   rOPINIRole  1 mg Oral QHS   sodium chloride flush  3  mL Intravenous Q12H   Continuous Infusions:  potassium chloride 10 mEq (08/19/21 1030)     LOS: 1 day   Time spent: 29 minutes   Darliss Cheney, MD [facility]  08/19/2021, 11:50 AM  Please page via [facility] [place] and do not message via secure chat for anything urgent. Secure chat can be used for anything non urgent.  How to contact the [facility] Attending or Consulting provider [place] or covering provider during after hours [place], for this patient?  Check the care team in [facility] and look for a) attending/consulting [facility] provider listed and b) the [facility] team listed. Page or secure chat [phone]. Log into [email or web address] and use [place]'s universal password to access. If you do not have the password, please contact the hospital operator. Locate the [facility] provider you are looking for under [facility] and page to a number  that you can be directly reached. If you still have difficulty reaching the provider, please page the [facility] (Director on Call) for the Hospitalists listed on amion for assistance.

## 2021-08-19 NOTE — Evaluation (Signed)
Occupational Therapy Evaluation Patient Details Name: Melissa Cardenas MRN: [id number] DOB: [birth year] Today's Date: 08/19/2021   History of Present Illness Pt is a 87 y.o. female who presented 08/17/21 with generalized weakness. Pt admitted with hyponatremia and acute on chronic diastolic CHF. PMH: chronic diastolic CHF with severe pulmonary hypertension and RV failure, chronic hypoxic respiratory failure on 2-2.5 L O2 via [place], permanent atrial fibrillation on Xarelto, CAD s/p CABG, history of CVA, chronic hyponatremia, restless leg syndrome   Clinical Impression   PT admitted with acute CHF. Pt currently with functional limitiations due to the deficits listed below (see OT problem list). Pt and family educated on possible needs from hospice at home: electric hospital bed, pads for incontinence, cushion for chair, water proof surfaces and BSC. Patient able to participate in conversation and states "I try to do but I am just so weak now".  Pt will benefit from skilled OT to increase their independence and safety with adls and balance to allow discharge home with family . Pt reports daughter has hired a full Geologist, engineering to help at home.       Recommendations for follow up therapy are one component of a multi-disciplinary discharge planning process, led by the attending physician.  Recommendations may be updated based on patient status, additional functional criteria and insurance authorization.   Follow Up Recommendations  Other (comment) (d/c hospice)    Assistance Recommended at Discharge Intermittent Supervision/Assistance  Functional Status Assessment  Patient has had a recent decline in their functional status and demonstrates the ability to make significant improvements in function in a reasonable and predictable amount of time.  Equipment Recommendations  BSC/3in1;Wheelchair (measurements OT);Wheelchair cushion (measurements OT);Hospital bed;Other (comment) (cushion)    Recommendations  for Other Services       Precautions / Restrictions Precautions Precautions: Fall Precaution Comments: 2.5 L O2 at baseline      Mobility Bed Mobility Overal bed mobility: Needs Assistance Bed Mobility: Rolling Rolling: Min assist         General bed mobility comments: positioned with blankets for comfort    Transfers                   General transfer comment: deferred pt states the legs are making me very tired      Balance                                           ADL either performed or assessed with clinical judgement   ADL Overall ADL's : Needs assistance/impaired Eating/Feeding: Minimal assistance;Bed level Eating/Feeding Details (indicate cue type and reason): drinking from cup with straw                                   General ADL Comments: pt with restless legs and given ativan 30 minutes prior to session. pt given blankets to weight legs to help with discomfort. educated on turningpatient every 2 hours while supine for skin integrity. talking to hospice about foam cushion for sitting inc hair     Vision         Perception     Praxis      Pertinent Vitals/Pain Pain Assessment: Faces Faces Pain Scale: Hurts a little bit Pain Location: restless legs Pain Descriptors / Indicators: Discomfort Pain Intervention(s): Limited activity within  patient's tolerance;Repositioned     Hand Dominance Right   Extremity/Trunk Assessment Upper Extremity Assessment Upper Extremity Assessment: Generalized weakness   Lower Extremity Assessment Lower Extremity Assessment: Generalized weakness   Cervical / Trunk Assessment Cervical / Trunk Assessment: Kyphotic   Communication Communication Communication: No difficulties   Cognition Arousal/Alertness: Awake/alert Behavior During Therapy: WFL for tasks assessed/performed Overall Cognitive Status: Within Functional Limits for tasks assessed                                  General Comments: Able to recall PLOF and hx appropriately and follow commands appropriately.     General Comments  family at bed side and educated on possible needs: extra linen for bed, mattress cover water proof, electric hospital bed to help with bedside care, bed side tray table, cushion for oob sitting, pillows for pressure relief, advocating for sponge baths, and suction equipment as patient is heavily using yaunkers acutely    Exercises     Shoulder Instructions      Home Living Family/patient expects to be discharged to:: Private residence Living Arrangements: Children Available Help at Discharge: Family;Personal care attendant;Available 24 hours/day Type of Home: House Home Access: Stairs to enter [employer] of Steps: 3 Entrance Stairs-Rails: Right;Left Home Layout: One level     Bathroom Shower/Tub: Producer, television/film/video: Standard     Home Equipment: Rollator (4 wheels);BSC/3in1;Shower seat;Grab bars - tub/shower;Cane - single point;Insurance risk surveyor (2 wheels)   Additional Comments: 2.5L O2 at baseline      Prior Functioning/Environment Prior Level of Function : Needs assist           ADLs (physical): IADLs;Bathing Mobility Comments: Daughter assists in bed mobility, stairs, and transfers. Supervises pt with gait using rollator, short household distances. Reports several falls in past 6 months. ADLs Comments: patient reports that they are hiring a full time nurse for home        OT Problem List: Decreased strength;Decreased activity tolerance;Cardiopulmonary status limiting activity;Decreased knowledge of precautions;Decreased knowledge of use of DME or AE      OT Treatment/Interventions: Self-care/ADL training;Therapeutic exercise;Energy conservation;DME and/or AE instruction;Therapeutic activities;Patient/family education;Balance training    OT Goals(Current goals can be found in the care plan  section) Acute Rehab OT Goals Patient Stated Goal: to go home tomorrow OT Goal Formulation: With patient/family Time For Goal Achievement: 09/01/21 Potential to Achieve Goals: Good  OT Frequency: Min 2X/week   Barriers to D/C:            Co-evaluation              AM-PAC OT "6 Clicks" Daily Activity     Outcome Measure Help from another person eating meals?: A Lot Help from another person taking care of personal grooming?: A Lot Help from another person toileting, which includes using toliet, bedpan, or urinal?: A Lot Help from another person bathing (including washing, rinsing, drying)?: A Lot Help from another person to put on and taking off regular upper body clothing?: A Lot Help from another person to put on and taking off regular lower body clothing?: A Lot 6 Click Score: 12   End of Session Equipment Utilized During Treatment: Oxygen Nurse Communication: Mobility status;Precautions  Activity Tolerance: Patient limited by fatigue Patient left: in bed;with call bell/phone within reach;with family/visitor present  OT Visit Diagnosis: Unsteadiness on feet (R26.81);Muscle weakness (generalized) (M62.81)  Time: 6378-5885 OT Time Calculation (min): 12 min Charges:  OT General Charges $OT Visit: 1 Visit OT Evaluation $OT Eval Low Complexity: 1 Low   Brynn, OTR/L  Acute Rehabilitation Services Pager: [phone] Office: [phone] .   Mateo Flow 08/19/2021, 3:51 PM

## 2021-08-20 DIAGNOSIS — E873 Alkalosis: Secondary | ICD-10-CM | POA: Diagnosis present

## 2021-08-20 DIAGNOSIS — I5033 Acute on chronic diastolic (congestive) heart failure: Secondary | ICD-10-CM | POA: Diagnosis not present

## 2021-08-20 LAB — BASIC METABOLIC PANEL
Anion gap: 4 — ABNORMAL LOW (ref 5–15)
BUN: 10 mg/dL (ref 8–23)
CO2: 37 mmol/L — ABNORMAL HIGH (ref 22–32)
Calcium: 8.9 mg/dL (ref 8.9–10.3)
Chloride: 82 mmol/L — ABNORMAL LOW (ref 98–111)
Creatinine, Ser: 0.87 mg/dL (ref 0.44–1.00)
GFR, Estimated: >60 mL/min (ref >60)
Glucose, Bld: 119 mg/dL — ABNORMAL HIGH (ref 70–99)
Potassium: 4.9 mmol/L (ref 3.5–5.1)
Sodium: 123 mmol/L — ABNORMAL LOW (ref 135–145)

## 2021-08-20 LAB — SODIUM: Sodium: 125 mmol/L — ABNORMAL LOW (ref 135–145)

## 2021-08-20 MED ORDER — FUROSEMIDE 40 MG PO TABS: 40.0000 mg | ORAL_TABLET | Freq: Every day | ORAL | 0 refills | Status: AC

## 2021-08-20 MED ORDER — GUAIFENESIN-DM 100-10 MG/5ML PO SYRP
5.0000 mL | ORAL_SOLUTION | ORAL | Status: DC | PRN
  Administered 2021-08-20: 5 mL via ORAL
  Filled 2021-08-20: qty 5

## 2021-08-20 MED ORDER — LORAZEPAM 0.5 MG PO TABS: 0.5000 mg | ORAL_TABLET | Freq: Three times a day (TID) | ORAL | 0 refills | Status: AC | PRN

## 2021-08-20 MED ORDER — MORPHINE SULFATE 10 MG/5ML PO SOLN: 5.0000 mg | ORAL | 0 refills | Status: AC | PRN

## 2021-08-20 MED ORDER — SPIRONOLACTONE 50 MG PO TABS
50.0000 mg | ORAL_TABLET | Freq: Every day | ORAL | 0 refills | Status: AC
Start: 2021-08-21 — End: 2021-09-20

## 2021-08-20 NOTE — Progress Notes (Signed)
Occupational Therapy Treatment Patient Details Name: Melissa Cardenas MRN: [id number] DOB: 1924-08-18 Today's Date: 08/20/2021   History of present illness Pt is a 87 y.o. female who presented 08/17/21 with generalized weakness. Pt admitted with hyponatremia and acute on chronic diastolic CHF. PMH: chronic diastolic CHF with severe pulmonary hypertension and RV failure, chronic hypoxic respiratory failure on 2-2.5 L O2 via [place], permanent atrial fibrillation on Xarelto, CAD s/p CABG, history of CVA, chronic hyponatremia, restless leg syndrome   OT comments  Pt agreed to attempt to complete activity at this time. Pt required min assist for self feeding due to stabilize to hold cup. Pt completed bed mobility with min assist and sit to stand transfers with min assist from elevated surface to FW and was only able to take 4 side steps then reported fatigued. Pt then required min assist for sitting to supine.  Pt currently with functional limitations due to the deficits listed below (see OT Problem List).  Pt will benefit from skilled OT to increase their safety and independence with ADL and functional mobility for ADL to facilitate discharge to venue listed below.     Recommendations for follow up therapy are one component of a multi-disciplinary discharge planning process, led by the attending physician.  Recommendations may be updated based on patient status, additional functional criteria and insurance authorization.    Follow Up Recommendations   (hospice)    Assistance Recommended at Discharge Frequent or constant Supervision/Assistance  Equipment Recommendations  BSC/3in1;Wheelchair (measurements OT);Wheelchair cushion (measurements OT);Hospital bed;Other (comment)    Recommendations for Other Services      Precautions / Restrictions Precautions Precautions: Fall Precaution Comments: 2.5 L O2 at baseline Restrictions Weight Bearing Restrictions: No       Mobility Bed Mobility Overal  bed mobility: Needs Assistance Bed Mobility: Rolling Rolling: Min assist   Supine to sit: Min assist;HOB elevated          Transfers Overall transfer level: Needs assistance Equipment used: Rolling walker (2 wheels)                     Balance Overall balance assessment: Needs assistance Sitting-balance support: Bilateral upper extremity supported Sitting balance-Leahy Scale: Fair     Standing balance support: Reliant on assistive device for balance Standing balance-Leahy Scale: Poor Standing balance comment: Reliant on RW                           ADL either performed or assessed with clinical judgement   ADL Overall ADL's : Needs assistance/impaired Eating/Feeding: Minimal assistance;Bed level   Grooming: Wash/dry hands;Wash/dry face;Minimal assistance;Cueing for safety;Cueing for sequencing   Upper Body Bathing: Moderate assistance;Cueing for safety;Cueing for sequencing;Sitting   Lower Body Bathing: Maximal assistance;Cueing for safety;Cueing for sequencing;Sit to/from stand   Upper Body Dressing : Moderate assistance;Cueing for safety;Cueing for sequencing;Sitting   Lower Body Dressing: Maximal assistance;Cueing for safety;Cueing for sequencing;Sit to/from stand   Toilet Transfer: Moderate assistance;Cueing for safety;Cueing for sequencing;BSC/3in1           Functional mobility during ADLs: Minimal assistance;Cueing for safety;Cueing for sequencing;Rolling walker (2 wheels)      Extremity/Trunk Assessment Upper Extremity Assessment Upper Extremity Assessment: Generalized weakness   Lower Extremity Assessment Lower Extremity Assessment: Generalized weakness        Vision       Perception     Praxis      Cognition Arousal/Alertness: Awake/alert Behavior During Therapy: WFL for tasks assessed/performed  Overall Cognitive Status: Within Functional Limits for tasks assessed                                             Exercises     Shoulder Instructions       General Comments      Pertinent Vitals/ Pain       Pain Assessment: Faces Faces Pain Scale: Hurts a little bit Pain Location: generally aching Pain Descriptors / Indicators: Discomfort Pain Intervention(s): Limited activity within patient's tolerance  Home Living                                          Prior Functioning/Environment              Frequency  Min 2X/week        Progress Toward Goals  OT Goals(current goals can now be found in the care plan section)  Progress towards OT goals: Progressing toward goals  Acute Rehab OT Goals Patient Stated Goal: to be comfortable OT Goal Formulation: With patient/family Time For Goal Achievement: 09/01/21 Potential to Achieve Goals: Good ADL Goals Additional ADL Goal #1: pt and family will demonstrate bed mobility mod I as precursor to adls  Plan Discharge plan remains appropriate    Co-evaluation                 AM-PAC OT "6 Clicks" Daily Activity     Outcome Measure   Help from another person eating meals?: A Lot Help from another person taking care of personal grooming?: A Lot Help from another person toileting, which includes using toliet, bedpan, or urinal?: A Lot Help from another person bathing (including washing, rinsing, drying)?: A Lot Help from another person to put on and taking off regular upper body clothing?: A Lot Help from another person to put on and taking off regular lower body clothing?: A Lot 6 Click Score: 12    End of Session Equipment Utilized During Treatment: Oxygen;Rolling walker (2 wheels)  OT Visit Diagnosis: Unsteadiness on feet (R26.81);Muscle weakness (generalized) (M62.81)   Activity Tolerance Patient limited by fatigue   Patient Left in bed;with call bell/phone within reach;with bed alarm set   Nurse Communication          Time: 8841-6606 OT Time Calculation (min): 19 min  Charges: OT  General Charges $OT Visit: 1 Visit OT Treatments $Self Care/Home Management : 8-22 mins  Alphia Moh OTR/L  Acute Rehab Services  [phone] office number [phone] pager number   Alphia Moh 08/20/2021, 1:17 PM

## 2021-08-20 NOTE — Progress Notes (Signed)
Occupational Therapy Treatment Patient Details Name: Melissa Cardenas MRN: [id number] DOB: 05/05/1924 Today's Date: 08/20/2021   History of present illness Pt is a 87 y.o. female who presented 08/17/21 with generalized weakness. Pt admitted with hyponatremia and acute on chronic diastolic CHF. PMH: chronic diastolic CHF with severe pulmonary hypertension and RV failure, chronic hypoxic respiratory failure on 2-2.5 L O2 via [place], permanent atrial fibrillation on Xarelto, CAD s/p CABG, history of CVA, chronic hyponatremia, restless leg syndrome   OT comments  Pt at this time had son and daughter in room and reviewed on needs for DME. Pt at this time reporting they feel a little better but fatigued and required min to mod assist with self feeding. Pt's family reporting they may want to try [facility] therapy once settled in the home with hospice care.  Pt currently with functional limitations due to the deficits listed below (see OT Problem List).  Pt will benefit from skilled OT to increase their safety and independence with ADL and functional mobility for ADL to facilitate discharge to venue listed below.     Recommendations for follow up therapy are one component of a multi-disciplinary discharge planning process, led by the attending physician.  Recommendations may be updated based on patient status, additional functional criteria and insurance authorization.    Follow Up Recommendations   (hospice but also spoke about possibly wanting some HH therapy once set up in the home.)    Assistance Recommended at Discharge Frequent or constant Supervision/Assistance  Equipment Recommendations  BSC/3in1;Wheelchair (measurements OT);Wheelchair cushion (measurements OT);Hospital bed;Other (comment)    Recommendations for Other Services      Precautions / Restrictions Precautions Precautions: Fall Precaution Comments: 2.5 L O2 at baseline Restrictions Weight Bearing Restrictions: No       Mobility Bed  Mobility Overal bed mobility: Needs Assistance Bed Mobility: Rolling Rolling: Min assist   Supine to sit: Min assist;HOB elevated          Transfers Overall transfer level: Needs assistance Equipment used: Rolling walker (2 wheels)                     Balance Overall balance assessment: Needs assistance Sitting-balance support: Bilateral upper extremity supported Sitting balance-Leahy Scale: Fair     Standing balance support: Reliant on assistive device for balance Standing balance-Leahy Scale: Poor Standing balance comment: Reliant on RW                           ADL either performed or assessed with clinical judgement   ADL Overall ADL's : Needs assistance/impaired Eating/Feeding: Minimal assistance;Bed level Eating/Feeding Details (indicate cue type and reason): needs min assist to self feed but increase in eating needed increase in assistance Grooming: Wash/dry hands;Wash/dry face;Minimal assistance;Cueing for safety;Cueing for sequencing   Upper Body Bathing: Moderate assistance;Cueing for safety;Cueing for sequencing;Sitting   Lower Body Bathing: Maximal assistance;Cueing for safety;Cueing for sequencing;Sit to/from stand   Upper Body Dressing : Moderate assistance;Cueing for safety;Cueing for sequencing;Sitting   Lower Body Dressing: Maximal assistance;Cueing for safety;Cueing for sequencing;Sit to/from stand   Toilet Transfer: Moderate assistance;Cueing for safety;Cueing for sequencing;BSC/3in1           Functional mobility during ADLs: Minimal assistance;Cueing for safety;Cueing for sequencing;Rolling walker (2 wheels) General ADL Comments: Pt's family in room and educated on how to complete ADLS due to decline in ability to particiapte and educated on what they were able to complete with prior visit today. Pt's  son and daughter voiced and understanding at this time.    Extremity/Trunk Assessment Upper Extremity Assessment Upper  Extremity Assessment: Generalized weakness   Lower Extremity Assessment Lower Extremity Assessment: Generalized weakness        Vision       Perception     Praxis      Cognition Arousal/Alertness: Awake/alert Behavior During Therapy: WFL for tasks assessed/performed Overall Cognitive Status: Within Functional Limits for tasks assessed                                            Exercises     Shoulder Instructions       General Comments      Pertinent Vitals/ Pain       Pain Assessment: No/denies pain Faces Pain Scale: Hurts a little bit Pain Location: generally aching Pain Descriptors / Indicators: Discomfort Pain Intervention(s): Limited activity within patient's tolerance  Home Living                                          Prior Functioning/Environment              Frequency  Min 2X/week        Progress Toward Goals  OT Goals(current goals can now be found in the care plan section)  Progress towards OT goals: Progressing toward goals  Acute Rehab OT Goals Patient Stated Goal: to go home OT Goal Formulation: With patient/family Time For Goal Achievement: 09/01/21 Potential to Achieve Goals: Good ADL Goals Additional ADL Goal #1: pt and family will demonstrate bed mobility mod I as precursor to adls  Plan Discharge plan remains appropriate    Co-evaluation                 AM-PAC OT "6 Clicks" Daily Activity     Outcome Measure   Help from another person eating meals?: A Lot Help from another person taking care of personal grooming?: A Lot Help from another person toileting, which includes using toliet, bedpan, or urinal?: A Lot Help from another person bathing (including washing, rinsing, drying)?: A Lot Help from another person to put on and taking off regular upper body clothing?: A Lot Help from another person to put on and taking off regular lower body clothing?: A Lot 6 Click Score: 12     End of Session Equipment Utilized During Treatment: Oxygen  OT Visit Diagnosis: Unsteadiness on feet (R26.81);Muscle weakness (generalized) (M62.81)   Activity Tolerance Patient limited by fatigue   Patient Left in bed;with call bell/phone within reach;with bed alarm set   Nurse Communication  (wanting to speak to nurse)        Time: 8115-7262 OT Time Calculation (min): 13 min  Charges: OT General Charges $OT Visit: 1 Visit OT Treatments $Self Care/Home Management : 8-22 mins  Alphia Moh OTR/L  Acute Rehab Services  [phone] office number [phone] pager number   Alphia Moh 08/20/2021, 1:24 PM

## 2021-08-20 NOTE — TOC CM/SW Note (Signed)
[facility] was called. Unable to give ETA. Message sent to nursing to make aware. Daughter Trula Ore was made aware that [facility] was being called as well.    Raiford Noble, MSN, RN,BSN [facility] Case Manager [phone]

## 2021-08-20 NOTE — Discharge Summary (Signed)
Physician Discharge Summary  Melissa Cardenas GMW:[id number] DOB: 12/11/1923 DOA: 08/17/2021  PCP: Andreas Blower., MD  Admit date: 08/17/2021 Discharge date: 08/20/2021 30 Day Unplanned Readmission Risk Score    Flowsheet Row ED to Hosp-Admission (Current) from 08/17/2021 in [facility] 3E HF PCU  30 Day Unplanned Readmission Risk Score (%) 14.02 Filed at 08/20/2021 0800       This score is the patient's risk of an unplanned readmission within 30 days of being discharged (0 -100%). The score is based on dignosis, age, lab data, medications, orders, and past utilization.   Low:  0-14.9   Medium: 15-21.9   High: 22-29.9   Extreme: 30 and above          Admitted From: Home Disposition: Home with hospice  Recommendations for Outpatient Follow-up:  Follow up with PCP in 1-2 weeks Please obtain BMP/CBC in one week Please follow up with your PCP on the following pending results: Unresulted Labs (From admission, onward)     Start     Ordered   08/18/21 1200  Sodium  Now then every 8 hours,   R (with TIMED occurrences)      08/18/21 1020              Home Health: None Equipment/Devices: Multiple DME's  Discharge Condition: Guarded CODE STATUS: DNR Diet recommendation: Food for comfort  Subjective: Seen and examined.  She tells me that this morning she had some trouble breathing but now she is feeling better.  She verified that she understands that she is on the path to pass away and she would rather like to be at home and be comfortable.  I have also discussed with the family and they also agree with this and they have received all DME's and they are ready to accept the patient to home with hospice today.  Brief/Interim Summary: Melissa Cardenas is a 87 y.o. female with medical history significant for chronic diastolic CHF with severe pulmonary hypertension and RV failure, chronic hypoxic respiratory failure on 2-2.5 L O2 via , permanent atrial fibrillation on  Xarelto, CAD s/p CABG, history of CVA, chronic hyponatremia, restless leg syndrome who presented to the ED for evaluation of generalized weakness.  Patient recently admitted at [facility] 08/12/2021-08/13/2021.  She initially presented for evaluation of abdominal pain.  CT abdomen/pelvis with contrast on 11/18 was negative for acute abdominal pelvic findings.     She was found to be hyponatremic with sodium 121.  Her home Lasix was held and she was treated with IV fluids overnight.  Repeat sodium was 126 and patient was discharged to home with hospitalist at home follow-up.   Patient was seen by telehospitalist on 11/21.  Repeat BMP was obtained and showed sodium 125.  Patient was advised to continue to hold diuretics.  She was scheduled for follow-up visit on 11/25.  Patient states that she was feeling okay for 1 day after she left the hospital however she has otherwise been feeling progressively weaker and fatigued with low appetite, nausea without emesis.  She has had frequent cough productive of clear sputum.   Upon arrival to ED, she was hemodynamically stable.  Sodium was 120.  COVID and influenza negative.  Chest x-ray showed pulmonary edema.  Patient was admitted under TRH.Last TTE 11/02/2020 showed EF 65-70%, severe pulmonary hypertension.  She was started on IV Lasix.  She had good urine output but this caused worsening of metabolic alkalosis.  She was then added on Aldactone and Lasix was  held yesterday.  Patient is requiring 2 to 3 L of oxygen, she is comfortable.  Unfortunately, due to sulfa allergy, we could not use Diamox.  Due to CHF acute exacerbation we could not use IV fluids either.  Her CO2 has been stable.  She was seen by palliative care 2 days ago and they had lengthy discussion with patient and family and all of them decided for the patient to go home with hospice.  All DME's were ordered by [facility] yesterday which have been received by the family yesterday and they are agreeable to take the  patient home today with hospice understanding that patient is on the path to pass away in near future.  I am also prescribing this patient some Ativan as needed and morphine as needed as recommended by palliative care.  During recent hospitalization, patient was asked not to use her Lasix.  She was taking 40 mg of Lasix twice daily previous to that.  I have resumed her at 40 mg once daily and I have added Aldactone.  Acute on chronic hyponatremia: Sodium 120 on admission compared to 126 on 11/19 , this has improved, 123 today.   Severe hypokalemia: Resolved.  Chronic respiratory failure with hypoxia: SPO2 currently stable on home 2-3 L O2 via Portal and she is at her baseline.   Permanent atrial fibrillation: Remains in atrial fibrillation with controlled rate on admission.  Continue Xarelto and Coreg.    Discharge Diagnoses:  Principal Problem:   Acute on chronic diastolic CHF (congestive heart failure) (HCC) Active Problems:   Hyponatremia   Permanent atrial fibrillation (HCC)   CAD (coronary artery disease)   Chronic respiratory failure with hypoxia (HCC)   Alkalosis, metabolic    Discharge Instructions   Allergies as of 08/20/2021       Reactions   Atromid S [clofibrate]    unknown   Avelox [moxifloxacin Hydrochloride]    whoosy and out of head   Nitrofuran Derivatives    unknown   Other    tetrosin allergy Unknown   Penicillins Hives, Itching   Sulfa Antibiotics Swelling   Apixaban Rash   Tetracycline Rash        Medication List     STOP taking these medications    clopidogrel 75 MG tablet Commonly known as: PLAVIX   phenazopyridine 200 MG tablet Commonly known as: PYRIDIUM       TAKE these medications    albuterol 108 (90 Base) MCG/ACT inhaler Commonly known as: VENTOLIN HFA Inhale 2 puffs into the lungs every 6 (six) hours as needed for wheezing or shortness of breath.   carvedilol 12.5 MG tablet Commonly known as: COREG Take 6.25 mg by mouth 2  (two) times daily. Take 1/2 tablet (6.25 mg) BID   cholecalciferol 25 MCG (1000 UNIT) tablet Commonly known as: VITAMIN D3 Take 1,000 Units by mouth daily.   Cholecalciferol 100 MCG (4000 UT) Caps Take 1 capsule by mouth daily.   DIPHENHYDRAMINE HCL (TOPICAL) 2 % Soln Apply 1 application topically daily as needed (itching).   FeroSul 325 (65 FE) MG tablet Generic drug: ferrous sulfate Take 325 mg by mouth 2 (two) times daily.   furosemide 40 MG tablet Commonly known as: LASIX Take 1 tablet (40 mg total) by mouth daily. What changed: when to take this   hydrocortisone 2.5 % cream Apply 1 application topically 2 (two) times daily as needed (itching).   Hyoscyamine Sulfate SL 0.125 MG Subl Place 0.125 mg under the tongue as  needed (for stomach).   LORazepam 0.5 MG tablet Commonly known as: Ativan Take 1 tablet (0.5 mg total) by mouth every 8 (eight) hours as needed for up to 10 days for anxiety.   morphine 10 MG/5ML solution Take 2.5 mLs (5 mg total) by mouth every 4 (four) hours as needed for up to 5 days for severe pain.   multivitamin with minerals Tabs tablet Take 1 tablet by mouth daily.   nitroGLYCERIN 0.4 MG SL tablet Commonly known as: NITROSTAT Place 0.4 mg under the tongue every 5 (five) minutes as needed for chest pain.   omeprazole 20 MG capsule Commonly known as: PRILOSEC Take 20 mg by mouth daily.   ondansetron 4 MG disintegrating tablet Commonly known as: Zofran ODT Take 1 tablet (4 mg total) by mouth every 8 (eight) hours as needed for nausea.   polyethylene glycol powder 17 GM/SCOOP powder Commonly known as: GLYCOLAX/MIRALAX Take 17 g by mouth daily as needed for moderate constipation.   PRESERVISION AREDS PO Take 1 tablet by mouth daily.   ranolazine 500 MG 12 hr tablet Commonly known as: RANEXA Take 500 mg by mouth 2 (two) times daily.   rOPINIRole 0.5 MG tablet Commonly known as: REQUIP Take 1 mg by mouth at bedtime.   rosuvastatin 40  MG tablet Commonly known as: CRESTOR Take 40 mg by mouth every evening.   spironolactone 50 MG tablet Commonly known as: ALDACTONE Take 1 tablet (50 mg total) by mouth daily. Start taking on: August 21, 2021   traMADol 50 MG tablet Commonly known as: ULTRAM Take 50 mg by mouth every 6 (six) hours as needed for moderate pain.   triamcinolone cream 0.1 % Commonly known as: KENALOG Apply 1 application topically 2 (two) times daily as needed (itching).   Xarelto 15 MG Tabs tablet Generic drug: Rivaroxaban Take 15 mg by mouth every evening.        Follow-up Information     [facility] Follow up.   Contact information: [address] [place] [address] [phone]         Andreas Blower., MD Follow up in 1 week(s).   Specialty: Internal Medicine Contact information: [address] [place] [address] [phone]                Allergies  Allergen Reactions   Atromid S [Clofibrate]     unknown   Avelox [Moxifloxacin Hydrochloride]     whoosy and out of head   Nitrofuran Derivatives     unknown   Other     tetrosin allergy Unknown    Penicillins Hives and Itching   Sulfa Antibiotics Swelling   Apixaban Rash   Tetracycline Rash    Consultations: Palliative care   Procedures/Studies: DG Abdomen Acute W/Chest  Result Date: 08/17/2021 CLINICAL DATA:  Cough and vomiting. EXAM: DG ABDOMEN ACUTE WITH 1 VIEW CHEST. Patient is rotated on frontal view. COMPARISON:  Chest x-ray 06/15/2016, CT chest 08/12/2021. FINDINGS: Increased cardiomegaly with some component likely due to AP portable technique and patient rotation. The heart and mediastinal contours are otherwise unchanged. Aortic calcification. No focal consolidation. Persistent coarsened interstitial markings with likely superimposed mild pulmonary edema. No pleural effusion. No pneumothorax. There is no evidence of dilated bowel loops or free intraperitoneal air.  No radiopaque calculi or other significant radiographic abnormality is seen. No acute osseous abnormality. Severe degenerative changes of the shoulder. IMPRESSION: 1. Increased cardiomegaly with some component likely due to  AP portable technique and patient rotation. Underlying pericardial effusion not excluded. 2. Mild pulmonary edema. 3. Nonobstructive bowel gas pattern. 4.  Aortic Atherosclerosis (ICD10-I70.0). Electronically Signed   By: Tish Frederickson M.D.   On: 08/17/2021 19:34   ECHOCARDIOGRAM COMPLETE  Result Date: 08/18/2021    ECHOCARDIOGRAM REPORT   Patient Name:   Melissa Cardenas Date of Exam: 08/18/2021 Medical Rec #:  [id number]       Height:       56.0 in Accession #:    [id number]      Weight:       113.8 lb Date of Birth:  August 19, 1924       BSA:          1.397 m Patient Age:    89 years        BP:           16/63 mmHg Patient Gender: F               HR:           84 bpm. Exam Location:  Inpatient Procedure: 2D Echo, 3D Echo, Cardiac Doppler and Color Doppler Indications:    I50.40* Unspecified combined systolic (congestive) and diastolic                 (congestive) heart failure  History:        Patient has prior history of Echocardiogram examinations, most                 recent 11/06/2016. CHF, CAD, Abnormal ECG, Stroke,                 Arrythmias:Atrial Fibrillation, Signs/Symptoms:Shortness of                 Breath and Dyspnea; Risk Factors:Hypertension and Dyslipidemia.                 Hypoxia.  Sonographer:    Sheralyn Boatman RDCS Referring Phys: [id number] VISHAL R PATEL IMPRESSIONS  1. Left ventricular ejection fraction, by estimation, is 65 to 70%. The left ventricle has normal function. The left ventricle has no regional wall motion abnormalities. There is mild concentric left ventricular hypertrophy. Left ventricular diastolic function could not be evaluated. There is the interventricular septum is flattened in systole and diastole, consistent with right ventricular pressure and volume  overload.  2. Right ventricular systolic function is severely reduced. The right ventricular size is severely enlarged. There is moderately elevated pulmonary artery systolic pressure. The estimated right ventricular systolic pressure is 55.2 mmHg.  3. Left atrial size was mildly dilated.  4. Right atrial size was severely dilated.  5. The mitral valve is degenerative. Moderate mitral valve regurgitation.  6. The tricuspid valve is abnormal. Tricuspid valve regurgitation is severe.  7. The aortic valve is tricuspid. There is moderate calcification of the aortic valve. There is moderate thickening of the aortic valve. Aortic valve regurgitation is not visualized. Mild to moderate aortic valve stenosis.  8. The inferior vena cava is dilated in size with <50% respiratory variability, suggesting right atrial pressure of 15 mmHg. Comparison(s): Prior images unable to be directly viewed, comparison made by report only. Changes from prior study are noted. Prior study unable to be viewed. RV is now severely dilated with severely reduced function. Severe TR. RVSP ~55 mmHG. FINDINGS  Left Ventricle: Left ventricular ejection fraction, by estimation, is 65 to 70%. The left ventricle has normal function. The left ventricle has no regional wall motion  abnormalities. The left ventricular internal cavity size was small. There is mild concentric left ventricular hypertrophy. The interventricular septum is flattened in systole and diastole, consistent with right ventricular pressure and volume overload. Left ventricular diastolic function could not be evaluated due to atrial fibrillation. Left ventricular diastolic function could not be evaluated. Right Ventricle: The right ventricular size is severely enlarged. No increase in right ventricular wall thickness. Right ventricular systolic function is severely reduced. There is moderately elevated pulmonary artery systolic pressure. The tricuspid regurgitant velocity is 3.17 m/s, and  with an assumed right atrial pressure of 15 mmHg, the estimated right ventricular systolic pressure is 55.2 mmHg. Left Atrium: Left atrial size was mildly dilated. Right Atrium: Right atrial size was severely dilated. Pericardium: There is no evidence of pericardial effusion. Mitral Valve: The mitral valve is degenerative in appearance. Moderate mitral valve regurgitation. Tricuspid Valve: The tricuspid valve is abnormal. Tricuspid valve regurgitation is severe. No evidence of tricuspid stenosis. The flow in the hepatic veins is reversed during ventricular systole. Aortic Valve: The aortic valve is tricuspid. There is moderate calcification of the aortic valve. There is moderate thickening of the aortic valve. Aortic valve regurgitation is not visualized. Mild to moderate aortic stenosis is present. Aortic valve mean gradient measures 5.0 mmHg. Aortic valve peak gradient measures 8.8 mmHg. Aortic valve area, by VTI measures 1.91 cm. Pulmonic Valve: The pulmonic valve was grossly normal. Pulmonic valve regurgitation is mild to moderate. No evidence of pulmonic stenosis. Aorta: The aortic root and ascending aorta are structurally normal, with no evidence of dilitation. Venous: The inferior vena cava is dilated in size with less than 50% respiratory variability, suggesting right atrial pressure of 15 mmHg. IAS/Shunts: The atrial septum is grossly normal.  LEFT VENTRICLE PLAX 2D LVIDd:         3.20 cm LVIDs:         1.40 cm LV PW:         1.20 cm LV IVS:        1.50 cm LVOT diam:     1.80 cm   3D Volume EF: LV SV:         49        3D EF:        73 % LV SV Index:   35        LV EDV:       65 ml LVOT Area:     2.54 cm  LV ESV:       17 ml                          LV SV:        48 ml RIGHT VENTRICLE            IVC RV S prime:     4.81 cm/s  IVC diam: 2.50 cm TAPSE (M-mode): 0.7 cm LEFT ATRIUM             Index        RIGHT ATRIUM           Index LA diam:        4.10 cm 2.94 cm/m   RA Area:     41.60 cm LA Vol (A2C):    57.8 ml 41.38 ml/m  RA Volume:   179.00 ml 128.16 ml/m LA Vol (A4C):   52.0 ml 37.23 ml/m LA Biplane Vol: 59.4 ml 42.53 ml/m  AORTIC VALVE  PULMONIC VALVE AV Area (Vmax):    2.07 cm      PR End Diast Vel: 1.81 msec AV Area (Vmean):   1.81 cm AV Area (VTI):     1.91 cm AV Vmax:           148.00 cm/s AV Vmean:          103.000 cm/s AV VTI:            0.257 m AV Peak Grad:      8.8 mmHg AV Mean Grad:      5.0 mmHg LVOT Vmax:         120.50 cm/s LVOT Vmean:        73.300 cm/s LVOT VTI:          0.192 m LVOT/AV VTI ratio: 0.75  AORTA Ao Root diam: 2.80 cm MITRAL VALVE                  TRICUSPID VALVE MV Area (PHT): 4.60 cm       TR Peak grad:   40.2 mmHg MV Decel Time: 165 msec       TR Vmax:        317.00 cm/s MR Peak grad:    78.5 mmHg MR Mean grad:    55.0 mmHg    SHUNTS MR Vmax:         443.00 cm/s  Systemic VTI:  0.19 m MR Vmean:        357.0 cm/s   Systemic Diam: 1.80 cm MR PISA:         1.01 cm MR PISA Eff ROA: 9 mm MR PISA Radius:  0.40 cm MV E velocity: 103.00 cm/s Lennie Odor MD Electronically signed by Lennie Odor MD Signature Date/Time: 08/18/2021/1:18:07 PM    Final      Discharge Exam: Vitals:   08/20/21 0351 08/20/21 1048  BP: 132/79 126/65  Pulse: 81 80  Resp: 19 18  Temp: 97.7 F (36.5 C) 97.8 F (36.6 C)  SpO2: 100% 100%   Vitals:   08/19/21 1143 08/19/21 1924 08/20/21 0351 08/20/21 1048  BP: 112/65 (!) 146/75 132/79 126/65  Pulse: 88 79 81 80  Resp: 18 (!) Temp: 98.2 F (36.8 C) (!) 97.5 F (36.4 C) 97.7 F (36.5 C) 97.8 F (36.6 C)  TempSrc: Oral Oral Oral Oral  SpO2: 98% 98% 100% 100%  Weight:   46.3 kg   Height:        General: Pt is alert, awake, not in acute distress, appears cachectic. Cardiovascular: RRR, S1/S2 +, no rubs, no gallops Respiratory: CTA bilaterally, no wheezing, no rhonchi Abdominal: Soft, NT, ND, bowel sounds + Extremities: no edema, no cyanosis    The results of significant diagnostics from this  hospitalization (including imaging, microbiology, ancillary and laboratory) are listed below for reference.     Microbiology: Recent Results (from the past 240 hour(s))  Resp Panel by RT-PCR (Flu A&B, Covid) Nasopharyngeal Swab     Status: None   Collection Time: 08/17/21  4:56 PM   Specimen: Nasopharyngeal Swab; Nasopharyngeal(NP) swabs in vial transport medium  Result Value Ref Range Status   SARS Coronavirus 2 by RT PCR NEGATIVE NEGATIVE Final    Comment: (NOTE) SARS-CoV-2 target nucleic acids are NOT DETECTED.  The SARS-CoV-2 RNA is generally detectable in upper respiratory specimens during the acute phase of infection. The lowest concentration of SARS-CoV-2 viral copies this assay can detect is 138 copies/mL. A negative result does  not preclude SARS-Cov-2 infection and should not be used as the sole basis for treatment or other patient management decisions. A negative result may occur with  improper specimen collection/handling, submission of specimen other than nasopharyngeal swab, presence of viral mutation(s) within the areas targeted by this assay, and inadequate number of viral copies(<138 copies/mL). A negative result must be combined with clinical observations, patient history, and epidemiological information. The expected result is Negative.  Fact Sheet for Patients:  BloggerCourse.com  Fact Sheet for Healthcare Providers:  SeriousBroker.it  This test is no t yet approved or cleared by the [place] FDA and  has been authorized for detection and/or diagnosis of SARS-CoV-2 by FDA under an Emergency Use Authorization (EUA). This EUA will remain  in effect (meaning this test can be used) for the duration of the COVID-19 declaration under Section 564(b)(1) of the Act, 21 U.S.C.section 360bbb-3(b)(1), unless the authorization is terminated  or revoked sooner.       Influenza A by PCR NEGATIVE NEGATIVE Final    Influenza B by PCR NEGATIVE NEGATIVE Final    Comment: (NOTE) The Xpert Xpress SARS-CoV-2/FLU/RSV plus assay is intended as an aid in the diagnosis of influenza from Nasopharyngeal swab specimens and should not be used as a sole basis for treatment. Nasal washings and aspirates are unacceptable for Xpert Xpress SARS-CoV-2/FLU/RSV testing.  Fact Sheet for Patients: BloggerCourse.com  Fact Sheet for Healthcare Providers: SeriousBroker.it  This test is not yet approved or cleared by the [place] FDA and has been authorized for detection and/or diagnosis of SARS-CoV-2 by FDA under an Emergency Use Authorization (EUA). This EUA will remain in effect (meaning this test can be used) for the duration of the COVID-19 declaration under Section 564(b)(1) of the Act, 21 U.S.C. section 360bbb-3(b)(1), unless the authorization is terminated or revoked.  Performed at [facility], [address], [place], [place] [address]      Labs: BNP (last 3 results) Recent Labs    01/06/21 1303 08/17/21 2302  BNP 343.5* 743.9*   Basic Metabolic Panel: Recent Labs  Lab 08/17/21 1710 08/18/21 0230 08/18/21 1216 08/18/21 2003 08/19/21 0342 08/19/21 0636 08/19/21 1154 08/19/21 1956 08/20/21 0341  NA 120* 123*   < > 126* 125*  --  124* 124* 123*  K 4.4 3.9  --   --  2.5*  --   --   --  4.9  CL 81* 83*  --   --  79*  --   --   --  82*  CO2 33* 33*  --   --  38*  --   --   --  37*  GLUCOSE 104* 111*  --   --  107*  --   --   --  119*  BUN 14 15  --   --  12  --   --   --  10  CREATININE 0.80 0.86  --   --  0.92  --   --   --  0.87  CALCIUM 9.1 8.9  --   --  8.4*  --   --   --  8.9  MG  --  2.2  --   --   --  2.2  --   --   --    < > = values in this interval not displayed.   Liver Function Tests: Recent Labs  Lab 08/17/21 1710  AST 34  ALT 22  ALKPHOS 71  BILITOT 1.1  PROT  6.8  ALBUMIN 3.4*   Recent Labs  Lab  08/17/21 1710  LIPASE 21   No results for input(s): AMMONIA in the last 168 hours. CBC: Recent Labs  Lab 08/17/21 1710 08/18/21 0230  WBC 6.6 9.2  NEUTROABS 5.7  --   HGB 10.1* 10.2*  HCT 31.5* 31.7*  MCV 92.6 91.6  PLT 160 172   Cardiac Enzymes: No results for input(s): CKTOTAL, CKMB, CKMBINDEX, TROPONINI in the last 168 hours. BNP: Invalid input(s): POCBNP CBG: Recent Labs  Lab 08/18/21 0317  GLUCAP 91   D-Dimer No results for input(s): DDIMER in the last 72 hours. Hgb A1c No results for input(s): HGBA1C in the last 72 hours. Lipid Profile No results for input(s): CHOL, HDL, LDLCALC, TRIG, CHOLHDL, LDLDIRECT in the last 72 hours. Thyroid function studies No results for input(s): TSH, T4TOTAL, T3FREE, THYROIDAB in the last 72 hours.  Invalid input(s): FREET3 Anemia work up No results for input(s): VITAMINB12, FOLATE, FERRITIN, TIBC, IRON, RETICCTPCT in the last 72 hours. Urinalysis    Component Value Date/Time   COLORURINE AMBER (A) 08/17/2021 2146   APPEARANCEUR CLOUDY (A) 08/17/2021 2146   LABSPEC 1.021 08/17/2021 2146   PHURINE 5.0 08/17/2021 2146   GLUCOSEU NEGATIVE 08/17/2021 2146   HGBUR NEGATIVE 08/17/2021 2146   BILIRUBINUR NEGATIVE 08/17/2021 2146   KETONESUR 5 (A) 08/17/2021 2146   PROTEINUR 100 (A) 08/17/2021 2146   NITRITE NEGATIVE 08/17/2021 2146   LEUKOCYTESUR TRACE (A) 08/17/2021 2146   Sepsis Labs Invalid input(s): PROCALCITONIN,  WBC,  LACTICIDVEN Microbiology Recent Results (from the past 240 hour(s))  Resp Panel by RT-PCR (Flu A&B, Covid) Nasopharyngeal Swab     Status: None   Collection Time: 08/17/21  4:56 PM   Specimen: Nasopharyngeal Swab; Nasopharyngeal(NP) swabs in vial transport medium  Result Value Ref Range Status   SARS Coronavirus 2 by RT PCR NEGATIVE NEGATIVE Final    Comment: (NOTE) SARS-CoV-2 target nucleic acids are NOT DETECTED.  The SARS-CoV-2 RNA is generally detectable in upper respiratory specimens during the  acute phase of infection. The lowest concentration of SARS-CoV-2 viral copies this assay can detect is 138 copies/mL. A negative result does not preclude SARS-Cov-2 infection and should not be used as the sole basis for treatment or other patient management decisions. A negative result may occur with  improper specimen collection/handling, submission of specimen other than nasopharyngeal swab, presence of viral mutation(s) within the areas targeted by this assay, and inadequate number of viral copies(<138 copies/mL). A negative result must be combined with clinical observations, patient history, and epidemiological information. The expected result is Negative.  Fact Sheet for Patients:  BloggerCourse.com  Fact Sheet for Healthcare Providers:  SeriousBroker.it  This test is no t yet approved or cleared by the [place] FDA and  has been authorized for detection and/or diagnosis of SARS-CoV-2 by FDA under an Emergency Use Authorization (EUA). This EUA will remain  in effect (meaning this test can be used) for the duration of the COVID-19 declaration under Section 564(b)(1) of the Act, 21 U.S.C.section 360bbb-3(b)(1), unless the authorization is terminated  or revoked sooner.       Influenza A by PCR NEGATIVE NEGATIVE Final   Influenza B by PCR NEGATIVE NEGATIVE Final    Comment: (NOTE) The Xpert Xpress SARS-CoV-2/FLU/RSV plus assay is intended as an aid in the diagnosis of influenza from Nasopharyngeal swab specimens and should not be used as a sole basis for treatment. Nasal washings and aspirates are unacceptable for Xpert Xpress SARS-CoV-2/FLU/RSV  testing.  Fact Sheet for Patients: BloggerCourse.com  Fact Sheet for Healthcare Providers: SeriousBroker.it  This test is not yet approved or cleared by the [place] FDA and has been authorized for detection and/or  diagnosis of SARS-CoV-2 by FDA under an Emergency Use Authorization (EUA). This EUA will remain in effect (meaning this test can be used) for the duration of the COVID-19 declaration under Section 564(b)(1) of the Act, 21 U.S.C. section 360bbb-3(b)(1), unless the authorization is terminated or revoked.  Performed at [facility], [address] [place], [place] [address]      Time coordinating discharge: Over 30 minutes  SIGNED:   Hughie Closs, MD  [facility] 08/20/2021, 11:05 AM  If 7PM-7AM, please contact night-coverage [email or web address]

## 2021-09-25 DEATH — deceased

## 2021-12-26 IMAGING — CT CT CHEST-ABD-PELV W/O CM
2 of 4 series · 14 of 36 positions shown, 16 images · non-contrast
Comparison: [DATE] [DATE], [DATE].  [DATE] [DATE], [DATE].

CLINICAL DATA: Left flank pain after fall.

EXAM:
CT CHEST, ABDOMEN AND PELVIS WITHOUT CONTRAST
TECHNIQUE: Multidetector CT imaging of the chest, abdomen and pelvis was
performed following the standard protocol without IV contrast.

[Series 2: axial st · axial · 0.72mm/px · z∈[-488,-38]mm · 11 of 108 slices shown, 13 images]
[im 9/108  mediastinal]
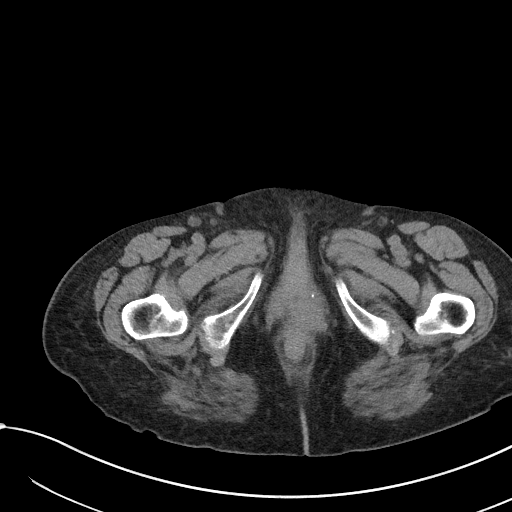
[im 9/108  bone]
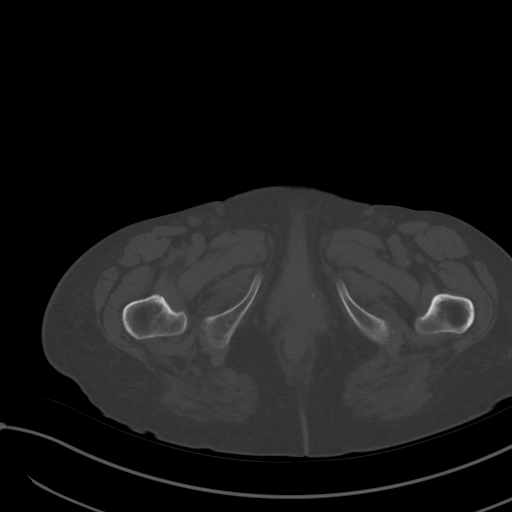
[im 18/108  mediastinal]
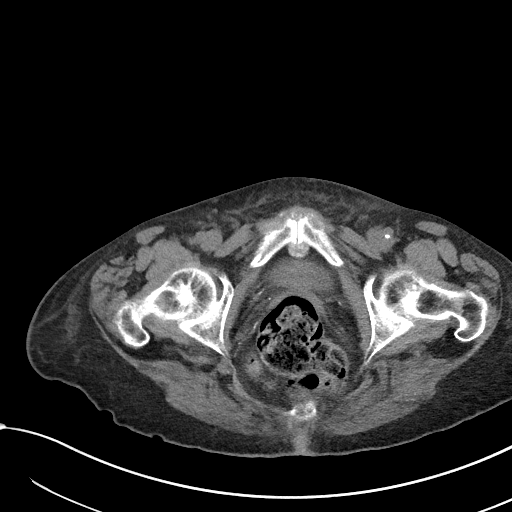
[im 27/108  mediastinal]
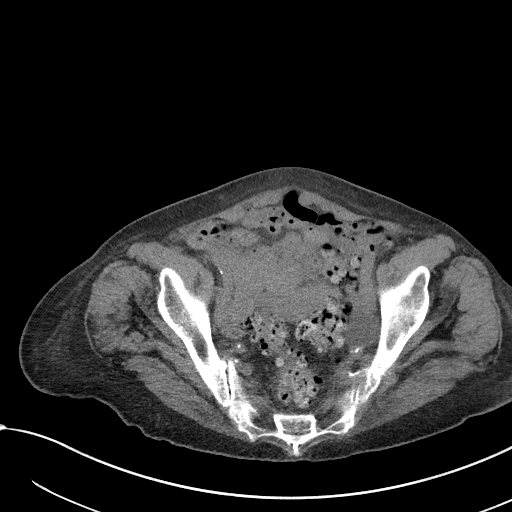
[im 36/108  mediastinal]
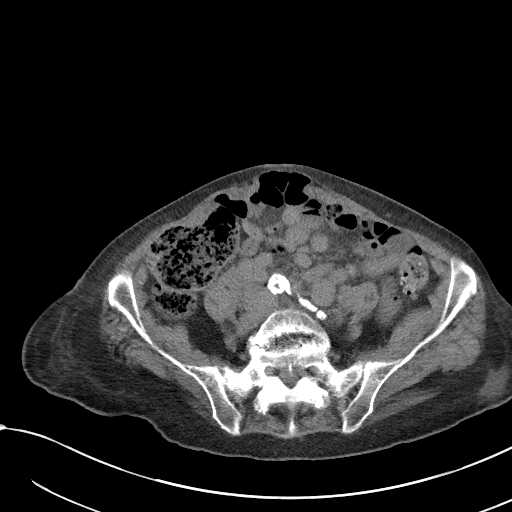
[im 45/108  mediastinal]
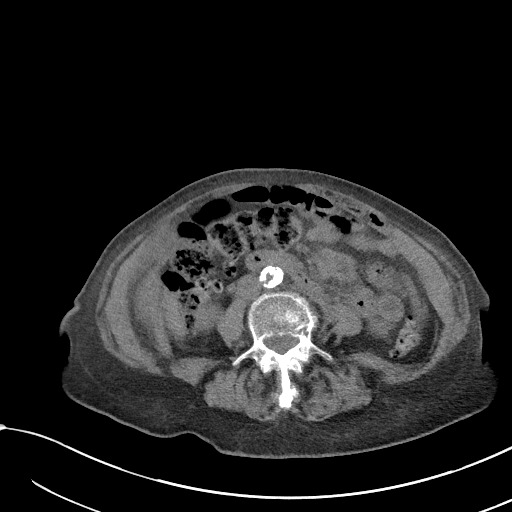
[im 54/108  mediastinal]
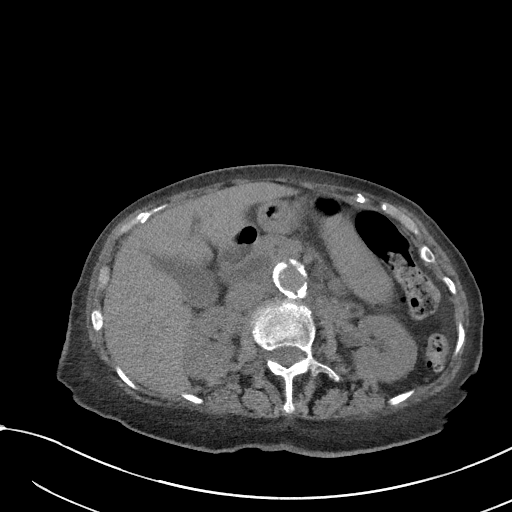
[im 63/108  mediastinal]
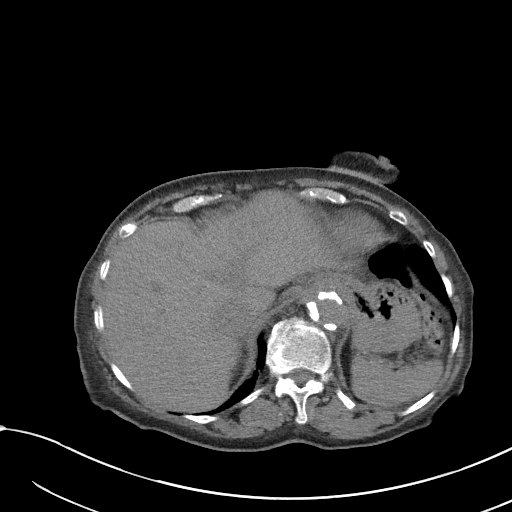
[im 72/108  mediastinal]
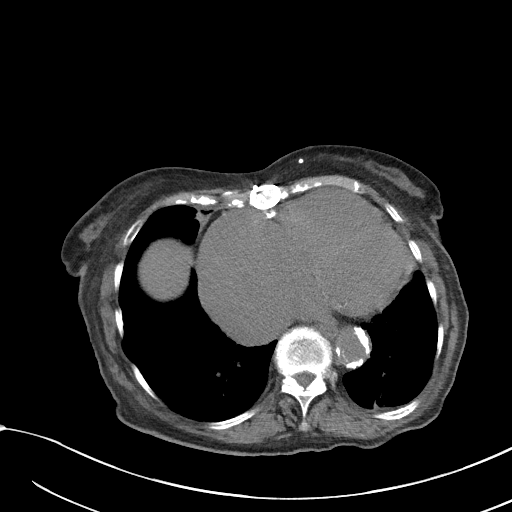
[im 81/108  mediastinal]
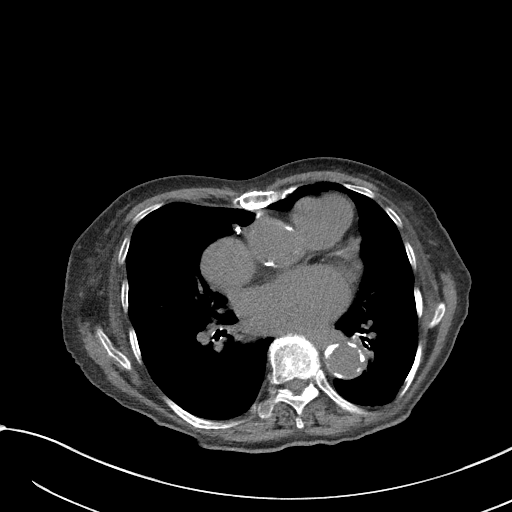
[im 81/108  bone]
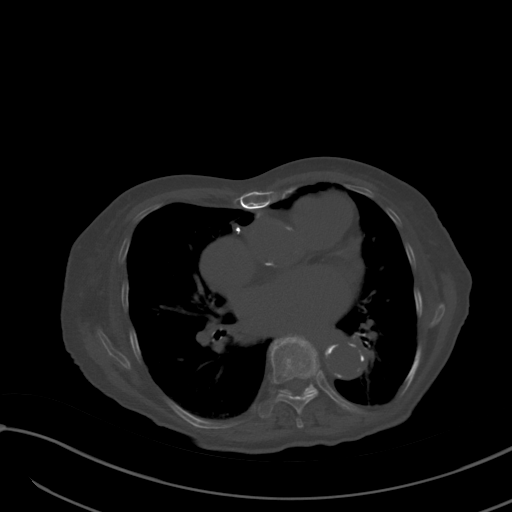
[im 90/108  mediastinal]
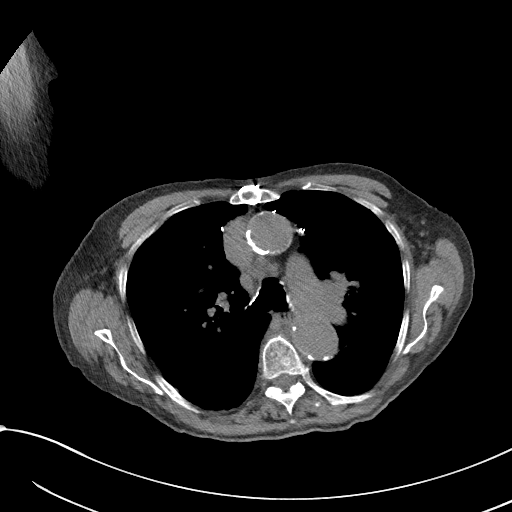
[im 99/108  mediastinal]
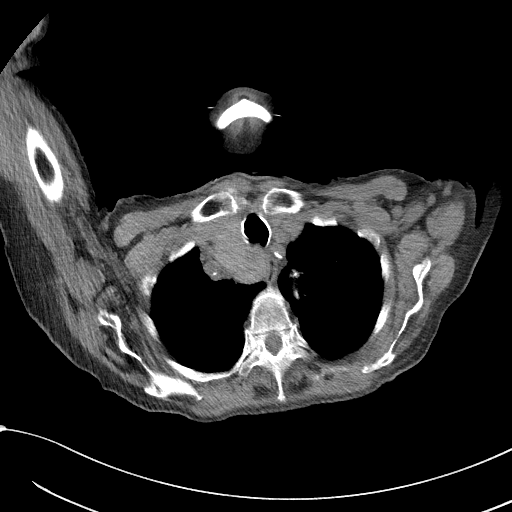

[Series 5: coronal · coronal · 0.64mm/px · 3 of 116 slices shown]
[im 24/116  mediastinal]
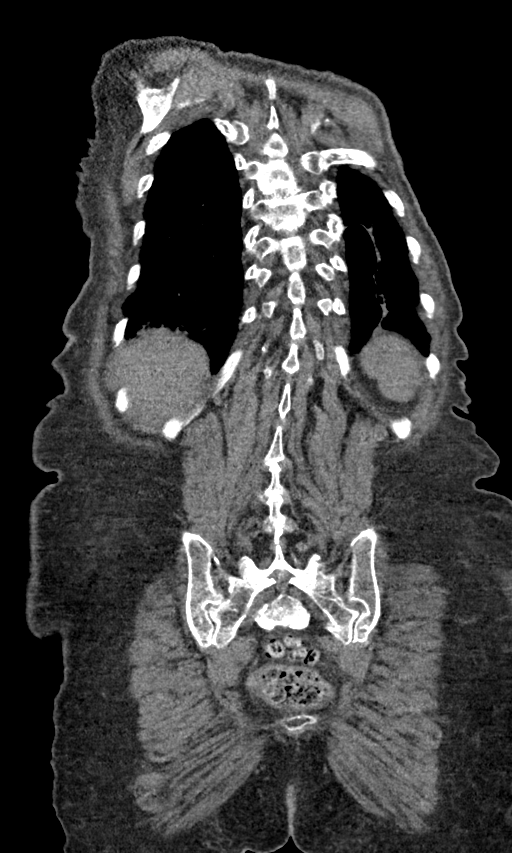
[im 47/116  mediastinal]
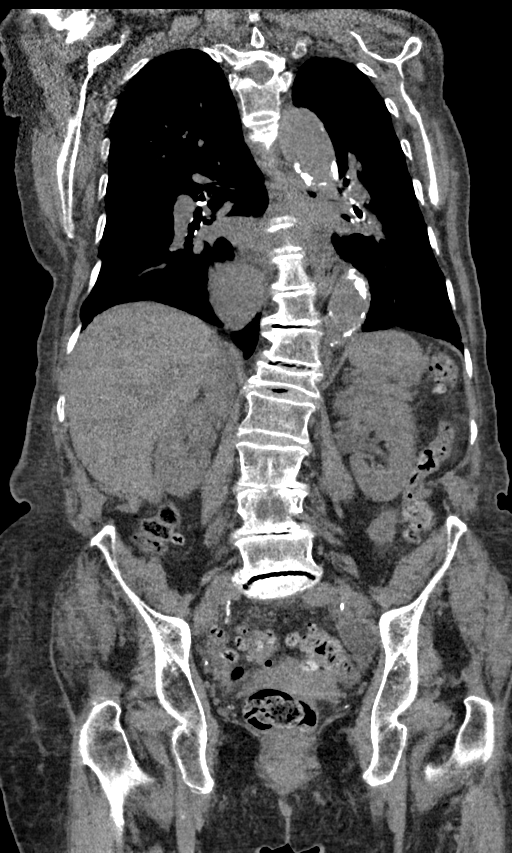
[im 70/116  mediastinal]
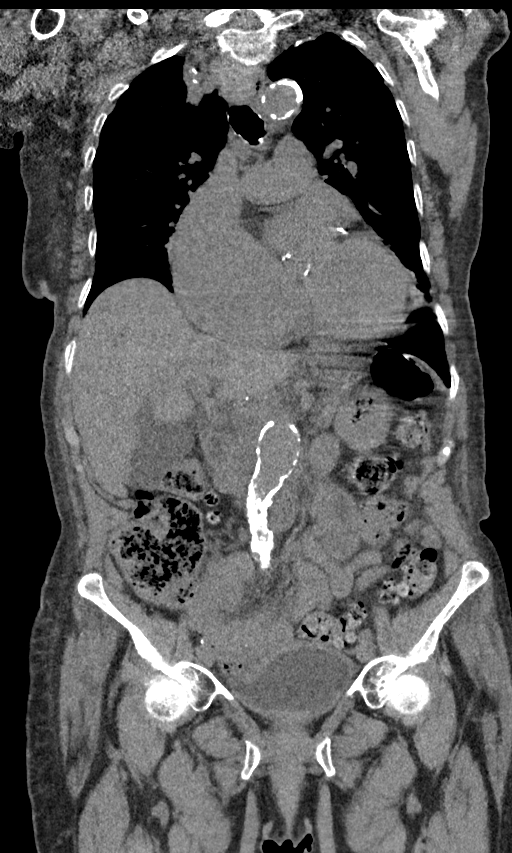

[14 of 36 positions shown; findings below may reference images not displayed]

FINDINGS: CT CHEST FINDINGS

Cardiovascular: Atherosclerosis of thoracic aorta is noted without
aneurysm formation. Moderate cardiomegaly is noted. Coronary artery
calcifications are noted. No pericardial effusion is noted.

Mediastinum/Nodes: No enlarged mediastinal, hilar, or axillary lymph
nodes. Thyroid gland, trachea, and esophagus demonstrate no
significant findings.

Lungs/Pleura: No pneumothorax or pleural effusion is noted. Mild
left basilar subsegmental atelectasis is noted.

Musculoskeletal: Moderate compression deformity of upper thoracic
vertebral body is noted consistent with fracture of indeterminate
age.

CT ABDOMEN PELVIS FINDINGS

Hepatobiliary: No focal liver abnormality is seen. No gallstones,
gallbladder wall thickening, or biliary dilatation.

Pancreas: Unremarkable. No pancreatic ductal dilatation or
surrounding inflammatory changes.

Spleen: Normal in size without focal abnormality.

Adrenals/Urinary Tract: Adrenal glands are unremarkable. Kidneys are
normal, without renal calculi, focal lesion, or hydronephrosis.
Bladder is unremarkable.

Stomach/Bowel: Stomach appears normal. There is no evidence of bowel
obstruction or inflammation. Sigmoid diverticulosis is noted without
inflammation. Stool is noted throughout the colon.

Vascular/Lymphatic: Aortic atherosclerosis. No enlarged abdominal or
pelvic lymph nodes.

Reproductive: Uterus and bilateral adnexa are unremarkable.

Other: No abdominal wall hernia or abnormality. No abdominopelvic
ascites.

Musculoskeletal: No acute or significant osseous findings.
IMPRESSION: Moderate compression deformity of upper thoracic vertebral body is
noted consistent with fracture of indeterminate age.

Coronary artery calcifications are noted.

Mild left basilar subsegmental atelectasis.

Sigmoid diverticulosis without inflammation.

Stool is noted throughout the colon.

Aortic Atherosclerosis ([id number]).

## 2022-05-03 IMAGING — DX DG ABDOMEN ACUTE W/ 1V CHEST
3 series · 3 of 3 positions shown · non-contrast
Comparison: Chest x-ray 06/15/2016, CT chest 08/12/2021.

CLINICAL DATA: Cough and vomiting.

EXAM:
DG ABDOMEN ACUTE WITH 1 VIEW CHEST. Patient is rotated on frontal
view.

[abdomen erect]
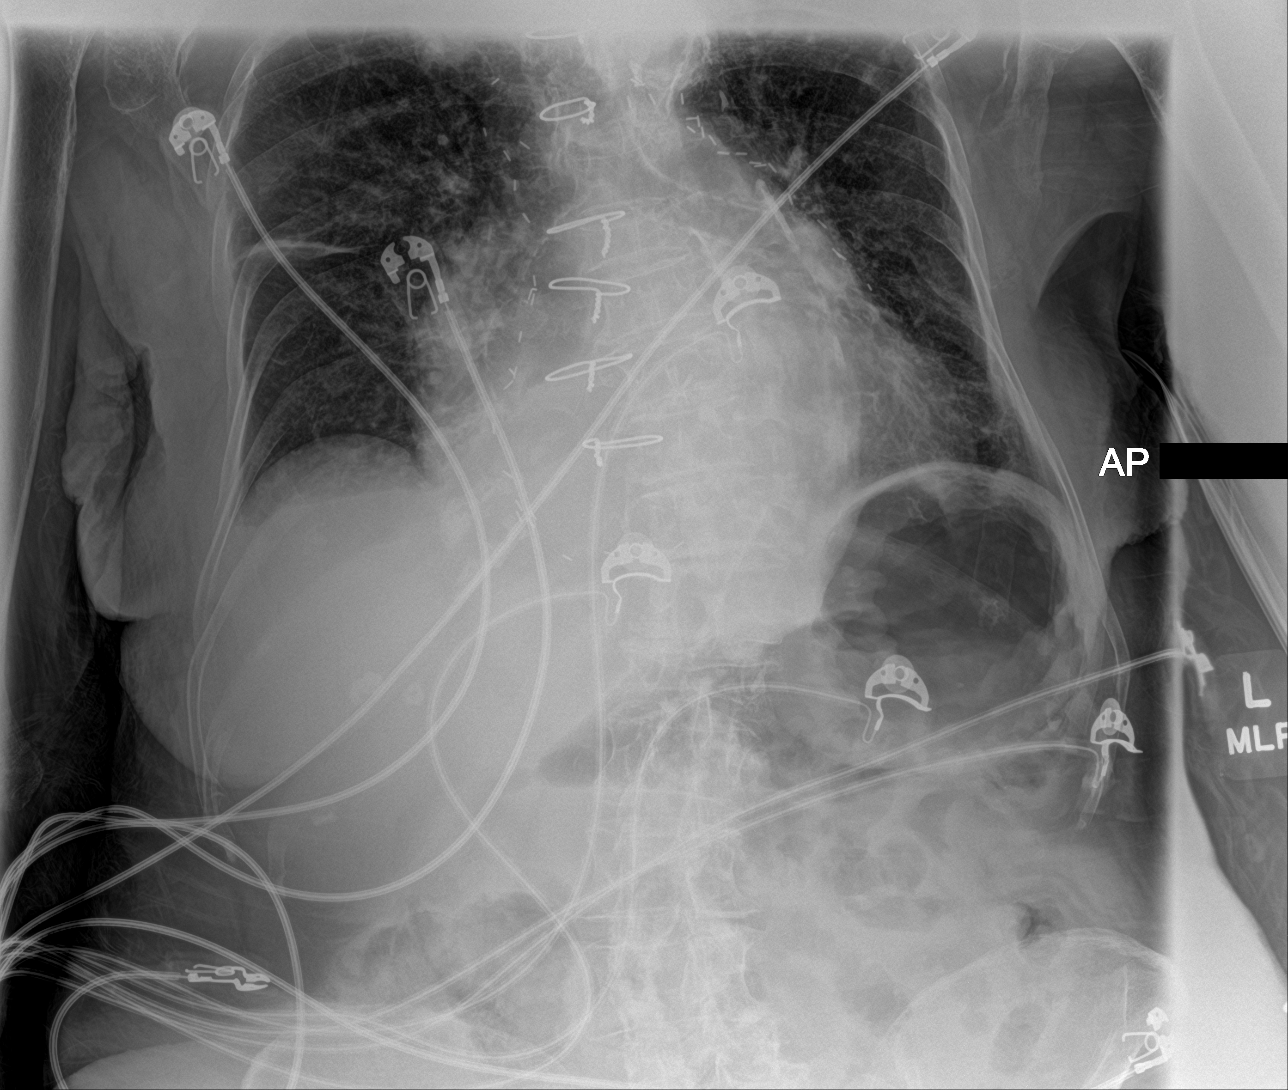

[abdomen supine]
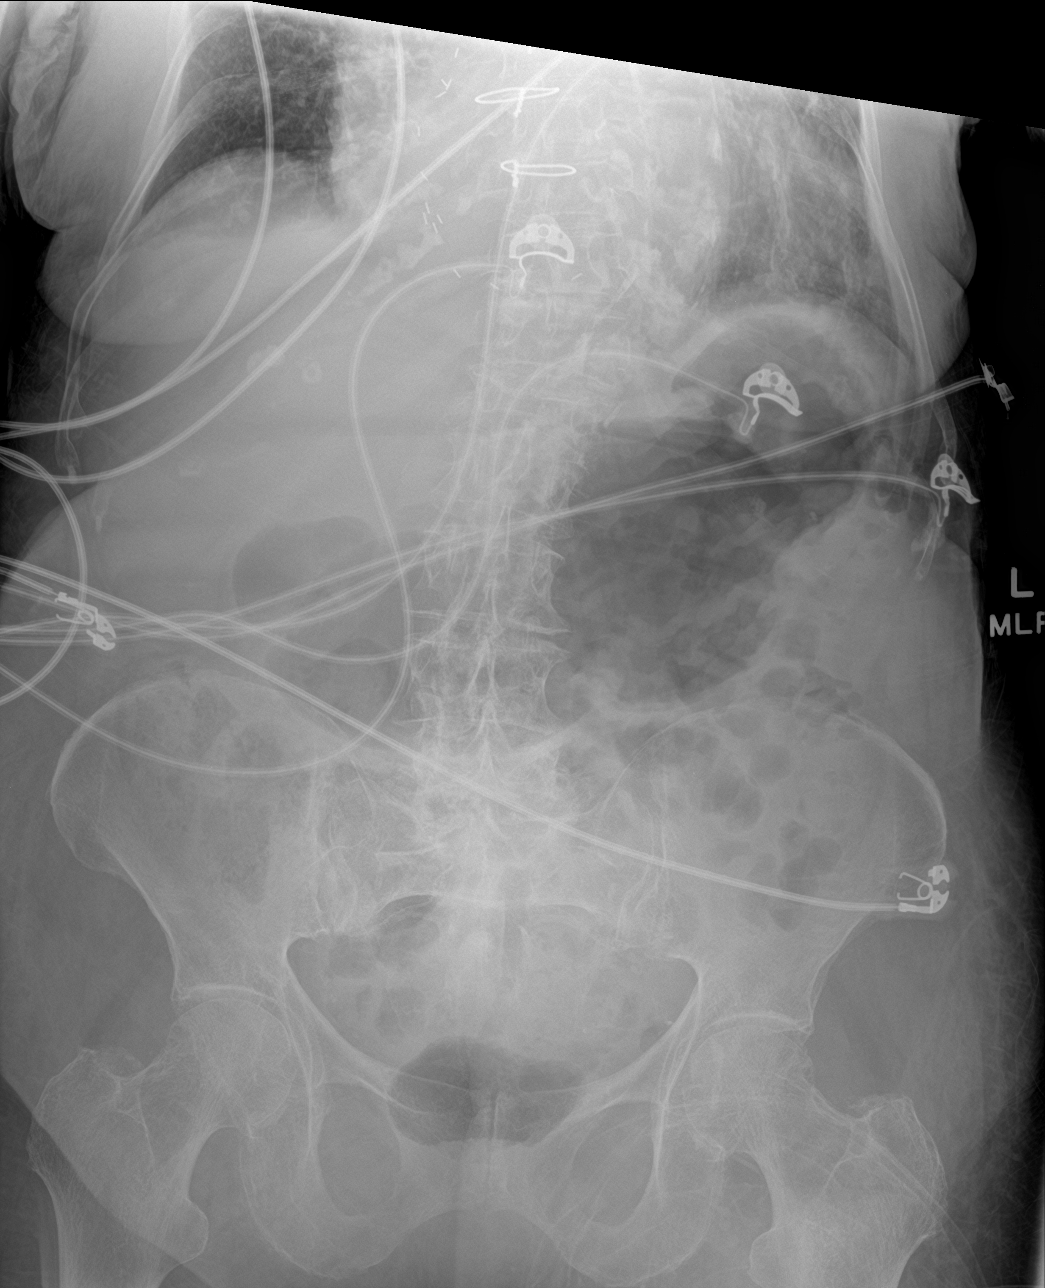

[chest ap]
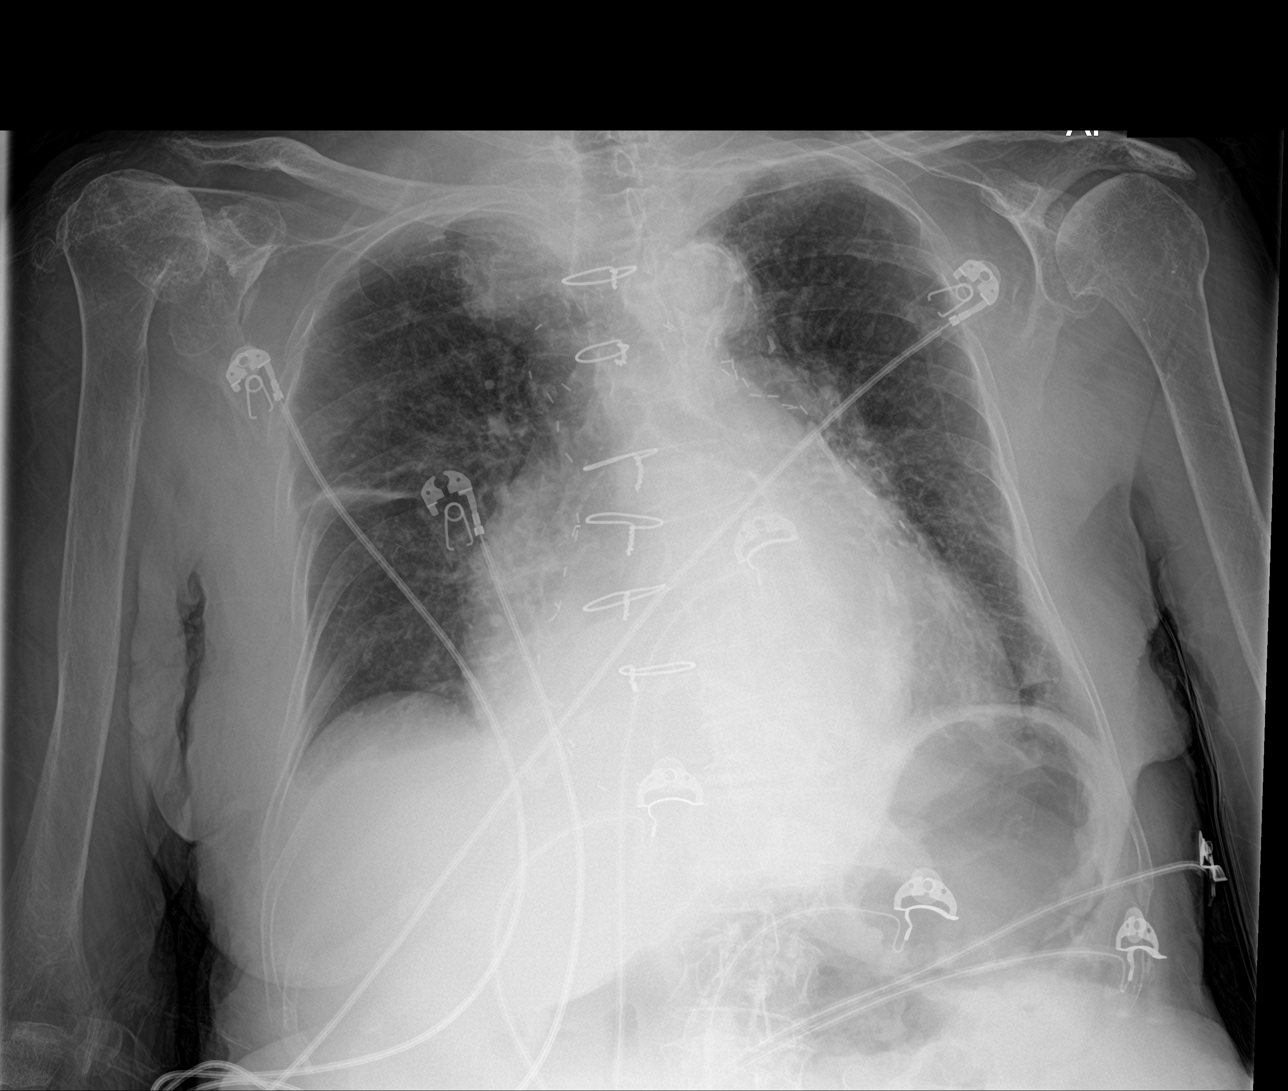

[3 of 3 positions shown; findings below may reference images not displayed]

FINDINGS: Increased cardiomegaly with some component likely due to AP portable
technique and patient rotation. The heart and mediastinal contours
are otherwise unchanged. Aortic calcification.

No focal consolidation. Persistent coarsened interstitial markings
with likely superimposed mild pulmonary edema. No pleural effusion.
No pneumothorax.

There is no evidence of dilated bowel loops or free intraperitoneal
air. No radiopaque calculi or other significant radiographic
abnormality is seen.

No acute osseous abnormality. Severe degenerative changes of the
shoulder.
IMPRESSION: 1. Increased cardiomegaly with some component likely due to AP
portable technique and patient rotation. Underlying pericardial
effusion not excluded.
2. Mild pulmonary edema.
3. Nonobstructive bowel gas pattern.
4.  Aortic Atherosclerosis ([id number]).
